# Patient Record
Sex: Female | Born: 1935
Health system: Southern US, Community
[De-identification: ages and names within clinical notes are randomized; demographics above are authoritative.]

## PROBLEM LIST (undated history)

## (undated) DIAGNOSIS — M1990 Unspecified osteoarthritis, unspecified site: Secondary | ICD-10-CM

## (undated) DIAGNOSIS — Z853 Personal history of malignant neoplasm of breast: Principal | ICD-10-CM

## (undated) DIAGNOSIS — C50919 Malignant neoplasm of unspecified site of unspecified female breast: Secondary | ICD-10-CM

## (undated) HISTORY — PX: BREAST LUMPECTOMY: SHX2

## (undated) HISTORY — PX: CATARACT EXTRACTION: SUR2

## (undated) HISTORY — DX: Personal history of malignant neoplasm of breast: Z85.3

## (undated) HISTORY — PX: BREAST EXCISIONAL BIOPSY: SUR124

## (undated) HISTORY — DX: Unspecified osteoarthritis, unspecified site: M19.90

## (undated) HISTORY — DX: Malignant neoplasm of unspecified site of unspecified female breast: C50.919

---

## 1998-08-02 ENCOUNTER — Ambulatory Visit (HOSPITAL_COMMUNITY): Admission: RE | Admit: 1998-08-02 | Discharge: 1998-08-02 | Payer: Self-pay | Admitting: Family Medicine

## 1998-08-20 DIAGNOSIS — C50919 Malignant neoplasm of unspecified site of unspecified female breast: Secondary | ICD-10-CM

## 1998-08-20 HISTORY — PX: BREAST SURGERY: SHX581

## 1998-08-20 HISTORY — DX: Malignant neoplasm of unspecified site of unspecified female breast: C50.919

## 1999-08-02 ENCOUNTER — Encounter (INDEPENDENT_AMBULATORY_CARE_PROVIDER_SITE_OTHER): Payer: Self-pay | Admitting: *Deleted

## 1999-08-02 ENCOUNTER — Ambulatory Visit (HOSPITAL_BASED_OUTPATIENT_CLINIC_OR_DEPARTMENT_OTHER): Admission: RE | Admit: 1999-08-02 | Discharge: 1999-08-02 | Payer: Self-pay | Admitting: Surgery

## 1999-08-02 DIAGNOSIS — Z853 Personal history of malignant neoplasm of breast: Secondary | ICD-10-CM

## 1999-08-02 HISTORY — DX: Personal history of malignant neoplasm of breast: Z85.3

## 1999-08-23 ENCOUNTER — Encounter: Admission: RE | Admit: 1999-08-23 | Discharge: 1999-11-21 | Payer: Self-pay | Admitting: *Deleted

## 1999-08-25 ENCOUNTER — Ambulatory Visit (HOSPITAL_COMMUNITY): Admission: RE | Admit: 1999-08-25 | Discharge: 1999-08-25 | Payer: Self-pay | Admitting: Surgery

## 1999-08-25 ENCOUNTER — Encounter (INDEPENDENT_AMBULATORY_CARE_PROVIDER_SITE_OTHER): Payer: Self-pay | Admitting: *Deleted

## 1999-08-25 ENCOUNTER — Encounter: Payer: Self-pay | Admitting: Surgery

## 2000-04-23 ENCOUNTER — Encounter: Payer: Self-pay | Admitting: Emergency Medicine

## 2000-04-23 ENCOUNTER — Emergency Department (HOSPITAL_COMMUNITY): Admission: EM | Admit: 2000-04-23 | Discharge: 2000-04-23 | Payer: Self-pay | Admitting: Emergency Medicine

## 2000-12-18 ENCOUNTER — Emergency Department (HOSPITAL_COMMUNITY): Admission: EM | Admit: 2000-12-18 | Discharge: 2000-12-18 | Payer: Self-pay | Admitting: Emergency Medicine

## 2000-12-18 ENCOUNTER — Encounter: Payer: Self-pay | Admitting: Emergency Medicine

## 2002-04-23 ENCOUNTER — Other Ambulatory Visit: Admission: RE | Admit: 2002-04-23 | Discharge: 2002-04-23 | Payer: Self-pay | Admitting: Family Medicine

## 2005-01-23 ENCOUNTER — Other Ambulatory Visit: Admission: RE | Admit: 2005-01-23 | Discharge: 2005-01-23 | Payer: Self-pay | Admitting: Gynecology

## 2005-04-11 ENCOUNTER — Encounter: Admission: RE | Admit: 2005-04-11 | Discharge: 2005-04-11 | Payer: Self-pay | Admitting: Family Medicine

## 2005-06-25 ENCOUNTER — Ambulatory Visit: Payer: Self-pay

## 2005-11-01 ENCOUNTER — Encounter: Admission: RE | Admit: 2005-11-01 | Discharge: 2005-11-01 | Payer: Self-pay | Admitting: Surgery

## 2006-11-04 ENCOUNTER — Encounter: Admission: RE | Admit: 2006-11-04 | Discharge: 2006-11-04 | Payer: Self-pay | Admitting: Surgery

## 2007-04-24 ENCOUNTER — Other Ambulatory Visit: Admission: RE | Admit: 2007-04-24 | Discharge: 2007-04-24 | Payer: Self-pay | Admitting: Gynecology

## 2007-11-05 ENCOUNTER — Encounter (INDEPENDENT_AMBULATORY_CARE_PROVIDER_SITE_OTHER): Payer: Self-pay | Admitting: Radiology

## 2007-11-05 ENCOUNTER — Encounter: Admission: RE | Admit: 2007-11-05 | Discharge: 2007-11-05 | Payer: Self-pay | Admitting: Family Medicine

## 2008-01-05 ENCOUNTER — Encounter: Admission: RE | Admit: 2008-01-05 | Discharge: 2008-01-05 | Payer: Self-pay | Admitting: Surgery

## 2008-01-06 ENCOUNTER — Ambulatory Visit (HOSPITAL_BASED_OUTPATIENT_CLINIC_OR_DEPARTMENT_OTHER): Admission: RE | Admit: 2008-01-06 | Discharge: 2008-01-06 | Payer: Self-pay | Admitting: Surgery

## 2008-01-06 ENCOUNTER — Encounter (INDEPENDENT_AMBULATORY_CARE_PROVIDER_SITE_OTHER): Payer: Self-pay | Admitting: Surgery

## 2008-01-06 ENCOUNTER — Encounter: Admission: RE | Admit: 2008-01-06 | Discharge: 2008-01-06 | Payer: Self-pay | Admitting: Surgery

## 2010-07-07 ENCOUNTER — Encounter: Admission: RE | Admit: 2010-07-07 | Discharge: 2010-07-07 | Payer: Self-pay | Admitting: Family Medicine

## 2011-01-02 NOTE — Op Note (Signed)
NAMEODESSER, TOURANGEAU                  ACCOUNT NO.:  [id number]   MEDICAL RECORD NO.:  [id number]          PATIENT TYPE:  AMB   LOCATION:  DSC                          FACILITY:  [facility]   PHYSICIAN:  Currie Paris, M.D.DATE OF BIRTH:  August 29, 1931   DATE OF PROCEDURE:  01/06/2008  DATE OF DISCHARGE:                               OPERATIVE REPORT   PREOPERATIVE DIAGNOSIS:  Right breast mass with history of carcinoma.   POSTOPERATIVE DIAGNOSIS:  Right breast mass with history of carcinoma.   OPERATION:  Needle-guided excision of right breast mass.   SURGEON:  Currie Paris, M.D.   ANESTHESIA:  MAC.   CLINICAL HISTORY:  This is a 77 year old lady who has undergone central  partial mastectomy in 2001 for receptor positive carcinoma.  She  recently had a mammogram showing an abnormality, and a biopsy showed fat  necrosis, but we were concerned that we had inadequate tissue and  nonconformity between the mammogram films and the report.  Therefore,  excisional biopsy was recommended.  The mass was not palpable.   DESCRIPTION OF PROCEDURE:  The patient was seen in the holding area.  She had no further questions.  We identified and marked the right breast  as the operative site.  I reviewed the guidewire films.  The radiologist  placed an ink mark x, directly over the mass, and guidewire entered  medially towards the sternum and tracked towards the nipple laterally in  a horizontal direction.   The patient was taken to the operative room.  After satisfactory IV  sedation, the breast was prepped and draped, and the timeout was done.   Then, I infiltrated 1% Xylocaine mixed with 0.5% plain Marcaine.  I made  directly transverse incision over the guidewire track and manipulated  the guidewire into the wound.  A hard area was palpable, and I went  around this with a cautery, trying to get completely around this in all  directions and going down towards the chest wall leaving the  fascia.   Specimen mammogram was done and the lesion appeared to be shown in that  specimen.  I got beyond the tip of the guidewire with the excision.   I made sure everything was dry and put more local in.  I then closed  with 3-0 Vicryl, 4-0 Monocryl subcuticular, and Dermabond.   The patient tolerated the procedure well, and there were no  complications.  All counts were correct.      Currie Paris, M.D.  Electronically Signed     CJS/MEDQ  D:  01/06/2008  T:  01/07/2008  Job:  [id number]   cc:   Otilio Connors. Gerri Spore, M.D.

## 2011-06-07 ENCOUNTER — Other Ambulatory Visit (HOSPITAL_COMMUNITY): Payer: Self-pay | Admitting: Family Medicine

## 2011-06-07 DIAGNOSIS — Z1231 Encounter for screening mammogram for malignant neoplasm of breast: Secondary | ICD-10-CM

## 2011-07-17 ENCOUNTER — Ambulatory Visit (HOSPITAL_COMMUNITY)
Admission: RE | Admit: 2011-07-17 | Discharge: 2011-07-17 | Disposition: A | Discharge status: Home / Self Care | Payer: Medicare Other | Source: Ambulatory Visit | Attending: Family Medicine | Admitting: Family Medicine

## 2011-07-17 DIAGNOSIS — Z1231 Encounter for screening mammogram for malignant neoplasm of breast: Secondary | ICD-10-CM | POA: Insufficient documentation

## 2012-02-05 DIAGNOSIS — E7800 Pure hypercholesterolemia, unspecified: Secondary | ICD-10-CM | POA: Diagnosis not present

## 2012-02-05 DIAGNOSIS — Z1211 Encounter for screening for malignant neoplasm of colon: Secondary | ICD-10-CM | POA: Diagnosis not present

## 2012-02-05 DIAGNOSIS — R7402 Elevation of levels of lactic acid dehydrogenase (LDH): Secondary | ICD-10-CM | POA: Diagnosis not present

## 2012-02-05 DIAGNOSIS — Z0000 Encounter for general adult medical examination without abnormal findings: Secondary | ICD-10-CM | POA: Diagnosis not present

## 2012-02-05 DIAGNOSIS — I8390 Asymptomatic varicose veins of unspecified lower extremity: Secondary | ICD-10-CM | POA: Diagnosis not present

## 2012-02-05 DIAGNOSIS — Z853 Personal history of malignant neoplasm of breast: Secondary | ICD-10-CM | POA: Diagnosis not present

## 2012-05-05 DIAGNOSIS — M545 Low back pain, unspecified: Secondary | ICD-10-CM | POA: Diagnosis not present

## 2012-05-30 DIAGNOSIS — Z23 Encounter for immunization: Secondary | ICD-10-CM | POA: Diagnosis not present

## 2012-06-02 DIAGNOSIS — IMO0002 Reserved for concepts with insufficient information to code with codable children: Secondary | ICD-10-CM | POA: Diagnosis not present

## 2012-06-02 DIAGNOSIS — M5137 Other intervertebral disc degeneration, lumbosacral region: Secondary | ICD-10-CM | POA: Diagnosis not present

## 2012-06-02 DIAGNOSIS — M5030 Other cervical disc degeneration, unspecified cervical region: Secondary | ICD-10-CM | POA: Diagnosis not present

## 2012-06-07 DIAGNOSIS — M5137 Other intervertebral disc degeneration, lumbosacral region: Secondary | ICD-10-CM | POA: Diagnosis not present

## 2012-06-16 DIAGNOSIS — M5137 Other intervertebral disc degeneration, lumbosacral region: Secondary | ICD-10-CM | POA: Diagnosis not present

## 2012-06-16 DIAGNOSIS — M5030 Other cervical disc degeneration, unspecified cervical region: Secondary | ICD-10-CM | POA: Diagnosis not present

## 2012-06-16 DIAGNOSIS — IMO0002 Reserved for concepts with insufficient information to code with codable children: Secondary | ICD-10-CM | POA: Diagnosis not present

## 2012-07-01 ENCOUNTER — Other Ambulatory Visit (HOSPITAL_COMMUNITY): Payer: Self-pay | Admitting: Family Medicine

## 2012-07-01 DIAGNOSIS — Z1231 Encounter for screening mammogram for malignant neoplasm of breast: Secondary | ICD-10-CM

## 2012-07-09 DIAGNOSIS — M5137 Other intervertebral disc degeneration, lumbosacral region: Secondary | ICD-10-CM | POA: Diagnosis not present

## 2012-07-22 ENCOUNTER — Ambulatory Visit (HOSPITAL_COMMUNITY)
Admission: RE | Admit: 2012-07-22 | Discharge: 2012-07-22 | Disposition: A | Discharge status: Home / Self Care | Payer: Medicare Other | Source: Ambulatory Visit | Attending: Family Medicine | Admitting: Family Medicine

## 2012-07-22 DIAGNOSIS — Z1231 Encounter for screening mammogram for malignant neoplasm of breast: Secondary | ICD-10-CM | POA: Insufficient documentation

## 2012-07-24 DIAGNOSIS — M5137 Other intervertebral disc degeneration, lumbosacral region: Secondary | ICD-10-CM | POA: Diagnosis not present

## 2012-07-25 ENCOUNTER — Other Ambulatory Visit: Payer: Self-pay | Admitting: Family Medicine

## 2012-07-25 DIAGNOSIS — R928 Other abnormal and inconclusive findings on diagnostic imaging of breast: Secondary | ICD-10-CM

## 2012-08-05 ENCOUNTER — Other Ambulatory Visit: Payer: Medicare Other

## 2012-08-19 ENCOUNTER — Other Ambulatory Visit: Payer: Medicare Other

## 2012-08-26 ENCOUNTER — Other Ambulatory Visit: Payer: Self-pay | Admitting: Family Medicine

## 2012-08-26 ENCOUNTER — Ambulatory Visit
Admission: RE | Admit: 2012-08-26 | Discharge: 2012-08-26 | Disposition: A | Discharge status: Home / Self Care | Payer: Medicare Other | Source: Ambulatory Visit | Attending: Family Medicine | Admitting: Family Medicine

## 2012-08-26 DIAGNOSIS — R928 Other abnormal and inconclusive findings on diagnostic imaging of breast: Secondary | ICD-10-CM

## 2012-09-02 ENCOUNTER — Inpatient Hospital Stay: Admission: RE | Admit: 2012-09-02 | Payer: Medicare Other | Source: Ambulatory Visit

## 2012-09-02 ENCOUNTER — Ambulatory Visit
Admission: RE | Admit: 2012-09-02 | Discharge: 2012-09-02 | Disposition: A | Discharge status: Home / Self Care | Payer: Medicare Other | Source: Ambulatory Visit | Attending: Family Medicine | Admitting: Family Medicine

## 2012-09-02 ENCOUNTER — Other Ambulatory Visit: Payer: Self-pay | Admitting: Family Medicine

## 2012-09-02 DIAGNOSIS — R928 Other abnormal and inconclusive findings on diagnostic imaging of breast: Secondary | ICD-10-CM

## 2012-09-02 DIAGNOSIS — N641 Fat necrosis of breast: Secondary | ICD-10-CM | POA: Diagnosis not present

## 2013-02-06 DIAGNOSIS — Z853 Personal history of malignant neoplasm of breast: Secondary | ICD-10-CM | POA: Diagnosis not present

## 2013-02-06 DIAGNOSIS — Z0000 Encounter for general adult medical examination without abnormal findings: Secondary | ICD-10-CM | POA: Diagnosis not present

## 2013-02-06 DIAGNOSIS — E7800 Pure hypercholesterolemia, unspecified: Secondary | ICD-10-CM | POA: Diagnosis not present

## 2013-02-06 DIAGNOSIS — Z1211 Encounter for screening for malignant neoplasm of colon: Secondary | ICD-10-CM | POA: Diagnosis not present

## 2013-02-06 DIAGNOSIS — I8390 Asymptomatic varicose veins of unspecified lower extremity: Secondary | ICD-10-CM | POA: Diagnosis not present

## 2013-04-01 DIAGNOSIS — E7800 Pure hypercholesterolemia, unspecified: Secondary | ICD-10-CM | POA: Diagnosis not present

## 2013-05-13 DIAGNOSIS — Z23 Encounter for immunization: Secondary | ICD-10-CM | POA: Diagnosis not present

## 2013-05-21 DIAGNOSIS — H2510 Age-related nuclear cataract, unspecified eye: Secondary | ICD-10-CM | POA: Diagnosis not present

## 2013-05-21 DIAGNOSIS — H02839 Dermatochalasis of unspecified eye, unspecified eyelid: Secondary | ICD-10-CM | POA: Diagnosis not present

## 2013-05-21 DIAGNOSIS — H26019 Infantile and juvenile cortical, lamellar, or zonular cataract, unspecified eye: Secondary | ICD-10-CM | POA: Diagnosis not present

## 2013-05-27 ENCOUNTER — Encounter (INDEPENDENT_AMBULATORY_CARE_PROVIDER_SITE_OTHER): Payer: Self-pay

## 2013-05-27 ENCOUNTER — Encounter (INDEPENDENT_AMBULATORY_CARE_PROVIDER_SITE_OTHER): Payer: Self-pay | Admitting: Surgery

## 2013-05-27 ENCOUNTER — Ambulatory Visit (INDEPENDENT_AMBULATORY_CARE_PROVIDER_SITE_OTHER): Payer: Medicare Other | Admitting: Surgery

## 2013-05-27 ENCOUNTER — Ambulatory Visit
Admission: RE | Admit: 2013-05-27 | Discharge: 2013-05-27 | Disposition: A | Discharge status: Home / Self Care | Payer: Medicare Other | Source: Ambulatory Visit | Attending: Surgery | Admitting: Surgery

## 2013-05-27 ENCOUNTER — Other Ambulatory Visit (INDEPENDENT_AMBULATORY_CARE_PROVIDER_SITE_OTHER): Payer: Self-pay | Admitting: Surgery

## 2013-05-27 VITALS — BP 116/76 | HR 84 | Temp 96.8°F | Ht <= 58 in | Wt 141.6 lb

## 2013-05-27 DIAGNOSIS — N6310 Unspecified lump in the right breast, unspecified quadrant: Secondary | ICD-10-CM

## 2013-05-27 DIAGNOSIS — N641 Fat necrosis of breast: Secondary | ICD-10-CM | POA: Diagnosis not present

## 2013-05-27 DIAGNOSIS — Z853 Personal history of malignant neoplasm of breast: Secondary | ICD-10-CM

## 2013-05-27 NOTE — Progress Notes (Signed)
NAME: Melody Wilson       DOB: 30-Sep-1931           DATE: 05/27/2013       MRN: [id number]  CC:   Chief Complaint  Patient presents with  . Breast Mass    eval Rt br lump    Melody Wilson is a 79 y.o.Marland Kitchenfemale who presents for routine followup of her Right IDC, stage 1, receptor+, Her2neg diagnosed in 2000 and treated with central partial mastectomy, radiation. She has no problems or concerns on either side.However in 2009 she had a Bx of the surgical area due to calcs, found to be fat necrosis. Again in Jan 2014 another NCB  For a small mass under the scar showed Fat necrosis. She was to have repeat sono, but the letter from the breast center stated left instead of right, so she cam to see me to find out waht was going on.  PFSH: She has had no significant changes since the last visit here.  ROS: There have been no significant changes since the last visit here  EXAM:  VS: BP 116/76  Pulse 84  Temp(Src) 96.8 F (36 C) (Temporal)  Ht 4\' 9"  (1.448 m)  Wt 141 lb 9.6 oz (64.229 kg)  BMI 30.63 kg/m2  SpO2 98%  General: The patient is alert, oriented, generally healthy appearing, NAD. Mood and affect are normal.  Breasts:  The left is normal The right is much smaller due to surgery and radiation. There is no definite palpable mass and overall feels like normal tissue.  Lymphatics: She has no axillary or supraclavicular adenopathy on either side.  Extremities: Full ROM of the surgical side with no lymphedema noted.  Data Reviewed: Reviewed my old notes, path reports and more recent mammograms and sonos.   Impression: Doing well, with no evidence of recurrent cancer or new cancer; Likely ongoing issues with fat necrosis  Plan: Discussed with Dr Deboraha Sprang at [facility]. They will see today to do sono. Long discussion with patient over her findings. There does not appear to be evidence of a local recurrence. If sono is stable, she should get f/u in six more months as part of her  routine annual f/u.

## 2013-05-27 NOTE — Patient Instructions (Signed)
Go up to the breast center so they can do the planned ultrasound of your right breast

## 2013-08-07 DIAGNOSIS — K5289 Other specified noninfective gastroenteritis and colitis: Secondary | ICD-10-CM | POA: Diagnosis not present

## 2013-09-01 ENCOUNTER — Other Ambulatory Visit: Payer: Self-pay

## 2013-09-01 DIAGNOSIS — Z1231 Encounter for screening mammogram for malignant neoplasm of breast: Secondary | ICD-10-CM

## 2013-09-04 ENCOUNTER — Ambulatory Visit
Admission: RE | Admit: 2013-09-04 | Discharge: 2013-09-04 | Disposition: A | Discharge status: Home / Self Care | Payer: Medicare Other | Source: Ambulatory Visit

## 2013-09-04 DIAGNOSIS — Z1231 Encounter for screening mammogram for malignant neoplasm of breast: Secondary | ICD-10-CM

## 2013-11-11 DIAGNOSIS — M25569 Pain in unspecified knee: Secondary | ICD-10-CM | POA: Diagnosis not present

## 2013-12-01 DIAGNOSIS — J309 Allergic rhinitis, unspecified: Secondary | ICD-10-CM | POA: Diagnosis not present

## 2014-02-10 DIAGNOSIS — J309 Allergic rhinitis, unspecified: Secondary | ICD-10-CM | POA: Diagnosis not present

## 2014-02-10 DIAGNOSIS — F329 Major depressive disorder, single episode, unspecified: Secondary | ICD-10-CM | POA: Diagnosis not present

## 2014-02-10 DIAGNOSIS — E7800 Pure hypercholesterolemia, unspecified: Secondary | ICD-10-CM | POA: Diagnosis not present

## 2014-02-10 DIAGNOSIS — F3289 Other specified depressive episodes: Secondary | ICD-10-CM | POA: Diagnosis not present

## 2014-02-10 DIAGNOSIS — Z23 Encounter for immunization: Secondary | ICD-10-CM | POA: Diagnosis not present

## 2014-02-10 DIAGNOSIS — Z0000 Encounter for general adult medical examination without abnormal findings: Secondary | ICD-10-CM | POA: Diagnosis not present

## 2014-03-10 DIAGNOSIS — L821 Other seborrheic keratosis: Secondary | ICD-10-CM | POA: Diagnosis not present

## 2014-03-10 DIAGNOSIS — L570 Actinic keratosis: Secondary | ICD-10-CM | POA: Diagnosis not present

## 2014-05-11 DIAGNOSIS — Z23 Encounter for immunization: Secondary | ICD-10-CM | POA: Diagnosis not present

## 2014-07-05 DIAGNOSIS — H2513 Age-related nuclear cataract, bilateral: Secondary | ICD-10-CM | POA: Diagnosis not present

## 2014-07-05 DIAGNOSIS — H25013 Cortical age-related cataract, bilateral: Secondary | ICD-10-CM | POA: Diagnosis not present

## 2014-08-04 DIAGNOSIS — S86919A Strain of unspecified muscle(s) and tendon(s) at lower leg level, unspecified leg, initial encounter: Secondary | ICD-10-CM | POA: Diagnosis not present

## 2014-11-15 ENCOUNTER — Other Ambulatory Visit: Payer: Self-pay

## 2014-11-15 ENCOUNTER — Ambulatory Visit
Admission: RE | Admit: 2014-11-15 | Discharge: 2014-11-15 | Disposition: A | Discharge status: Home / Self Care | Payer: Medicare Other | Source: Ambulatory Visit

## 2014-11-15 DIAGNOSIS — Z1231 Encounter for screening mammogram for malignant neoplasm of breast: Secondary | ICD-10-CM

## 2014-11-15 DIAGNOSIS — Z9889 Other specified postprocedural states: Secondary | ICD-10-CM

## 2014-12-27 DIAGNOSIS — M5442 Lumbago with sciatica, left side: Secondary | ICD-10-CM | POA: Diagnosis not present

## 2014-12-27 DIAGNOSIS — M5441 Lumbago with sciatica, right side: Secondary | ICD-10-CM | POA: Diagnosis not present

## 2014-12-27 DIAGNOSIS — M5136 Other intervertebral disc degeneration, lumbar region: Secondary | ICD-10-CM | POA: Diagnosis not present

## 2015-01-26 DIAGNOSIS — M5136 Other intervertebral disc degeneration, lumbar region: Secondary | ICD-10-CM | POA: Diagnosis not present

## 2015-02-10 DIAGNOSIS — M542 Cervicalgia: Secondary | ICD-10-CM | POA: Diagnosis not present

## 2015-02-10 DIAGNOSIS — M5441 Lumbago with sciatica, right side: Secondary | ICD-10-CM | POA: Diagnosis not present

## 2015-04-08 DIAGNOSIS — E780 Pure hypercholesterolemia: Secondary | ICD-10-CM | POA: Diagnosis not present

## 2015-04-08 DIAGNOSIS — J309 Allergic rhinitis, unspecified: Secondary | ICD-10-CM | POA: Diagnosis not present

## 2015-04-08 DIAGNOSIS — Z0000 Encounter for general adult medical examination without abnormal findings: Secondary | ICD-10-CM | POA: Diagnosis not present

## 2015-04-08 DIAGNOSIS — F3341 Major depressive disorder, recurrent, in partial remission: Secondary | ICD-10-CM | POA: Diagnosis not present

## 2015-04-08 DIAGNOSIS — R109 Unspecified abdominal pain: Secondary | ICD-10-CM | POA: Diagnosis not present

## 2015-04-15 ENCOUNTER — Other Ambulatory Visit: Payer: Self-pay | Admitting: Family Medicine

## 2015-04-15 DIAGNOSIS — R1084 Generalized abdominal pain: Secondary | ICD-10-CM

## 2015-05-04 ENCOUNTER — Other Ambulatory Visit: Payer: Self-pay

## 2015-05-09 DIAGNOSIS — M810 Age-related osteoporosis without current pathological fracture: Secondary | ICD-10-CM | POA: Diagnosis not present

## 2015-05-23 DIAGNOSIS — Z23 Encounter for immunization: Secondary | ICD-10-CM | POA: Diagnosis not present

## 2015-06-16 DIAGNOSIS — K2970 Gastritis, unspecified, without bleeding: Secondary | ICD-10-CM | POA: Diagnosis not present

## 2015-06-16 DIAGNOSIS — J309 Allergic rhinitis, unspecified: Secondary | ICD-10-CM | POA: Diagnosis not present

## 2015-06-16 DIAGNOSIS — S01302A Unspecified open wound of left ear, initial encounter: Secondary | ICD-10-CM | POA: Diagnosis not present

## 2015-06-16 DIAGNOSIS — H6122 Impacted cerumen, left ear: Secondary | ICD-10-CM | POA: Diagnosis not present

## 2015-07-19 DIAGNOSIS — M810 Age-related osteoporosis without current pathological fracture: Secondary | ICD-10-CM | POA: Diagnosis not present

## 2015-07-19 DIAGNOSIS — K2970 Gastritis, unspecified, without bleeding: Secondary | ICD-10-CM | POA: Diagnosis not present

## 2015-08-01 DIAGNOSIS — H2513 Age-related nuclear cataract, bilateral: Secondary | ICD-10-CM | POA: Diagnosis not present

## 2015-08-01 DIAGNOSIS — H25013 Cortical age-related cataract, bilateral: Secondary | ICD-10-CM | POA: Diagnosis not present

## 2015-10-12 ENCOUNTER — Other Ambulatory Visit: Payer: Self-pay

## 2015-10-12 DIAGNOSIS — Z1231 Encounter for screening mammogram for malignant neoplasm of breast: Secondary | ICD-10-CM

## 2015-11-16 ENCOUNTER — Ambulatory Visit
Admission: RE | Admit: 2015-11-16 | Discharge: 2015-11-16 | Disposition: A | Discharge status: Home / Self Care | Payer: Medicare Other | Source: Ambulatory Visit

## 2015-11-16 DIAGNOSIS — Z1231 Encounter for screening mammogram for malignant neoplasm of breast: Secondary | ICD-10-CM | POA: Diagnosis not present

## 2015-11-21 ENCOUNTER — Other Ambulatory Visit: Payer: Self-pay | Admitting: Family Medicine

## 2015-11-21 DIAGNOSIS — R928 Other abnormal and inconclusive findings on diagnostic imaging of breast: Secondary | ICD-10-CM

## 2015-11-28 DIAGNOSIS — J0110 Acute frontal sinusitis, unspecified: Secondary | ICD-10-CM | POA: Diagnosis not present

## 2015-12-08 ENCOUNTER — Ambulatory Visit
Admission: RE | Admit: 2015-12-08 | Discharge: 2015-12-08 | Disposition: A | Discharge status: Home / Self Care | Payer: Medicare Other | Source: Ambulatory Visit | Attending: Family Medicine | Admitting: Family Medicine

## 2015-12-08 ENCOUNTER — Other Ambulatory Visit: Payer: Self-pay | Admitting: Family Medicine

## 2015-12-08 DIAGNOSIS — N6310 Unspecified lump in the right breast, unspecified quadrant: Secondary | ICD-10-CM

## 2015-12-08 DIAGNOSIS — R928 Other abnormal and inconclusive findings on diagnostic imaging of breast: Secondary | ICD-10-CM

## 2015-12-08 DIAGNOSIS — N63 Unspecified lump in breast: Secondary | ICD-10-CM | POA: Diagnosis not present

## 2015-12-08 DIAGNOSIS — N6012 Diffuse cystic mastopathy of left breast: Secondary | ICD-10-CM | POA: Diagnosis not present

## 2015-12-12 ENCOUNTER — Other Ambulatory Visit: Payer: Self-pay | Admitting: Family Medicine

## 2015-12-12 DIAGNOSIS — N6310 Unspecified lump in the right breast, unspecified quadrant: Secondary | ICD-10-CM

## 2015-12-13 ENCOUNTER — Ambulatory Visit
Admission: RE | Admit: 2015-12-13 | Discharge: 2015-12-13 | Disposition: A | Discharge status: Home / Self Care | Payer: Medicare Other | Source: Ambulatory Visit | Attending: Family Medicine | Admitting: Family Medicine

## 2015-12-13 DIAGNOSIS — N6310 Unspecified lump in the right breast, unspecified quadrant: Secondary | ICD-10-CM

## 2015-12-13 DIAGNOSIS — N641 Fat necrosis of breast: Secondary | ICD-10-CM | POA: Diagnosis not present

## 2015-12-13 DIAGNOSIS — N63 Unspecified lump in breast: Secondary | ICD-10-CM | POA: Diagnosis not present

## 2016-02-06 DIAGNOSIS — R079 Chest pain, unspecified: Secondary | ICD-10-CM | POA: Diagnosis not present

## 2016-04-09 DIAGNOSIS — Z0000 Encounter for general adult medical examination without abnormal findings: Secondary | ICD-10-CM | POA: Diagnosis not present

## 2016-04-09 DIAGNOSIS — F3341 Major depressive disorder, recurrent, in partial remission: Secondary | ICD-10-CM | POA: Diagnosis not present

## 2016-04-09 DIAGNOSIS — K2970 Gastritis, unspecified, without bleeding: Secondary | ICD-10-CM | POA: Diagnosis not present

## 2016-04-09 DIAGNOSIS — E7800 Pure hypercholesterolemia, unspecified: Secondary | ICD-10-CM | POA: Diagnosis not present

## 2016-05-01 DIAGNOSIS — Z23 Encounter for immunization: Secondary | ICD-10-CM | POA: Diagnosis not present

## 2016-10-24 DIAGNOSIS — R079 Chest pain, unspecified: Secondary | ICD-10-CM | POA: Diagnosis not present

## 2016-10-24 DIAGNOSIS — L309 Dermatitis, unspecified: Secondary | ICD-10-CM | POA: Diagnosis not present

## 2016-10-24 DIAGNOSIS — R1013 Epigastric pain: Secondary | ICD-10-CM | POA: Diagnosis not present

## 2016-11-14 ENCOUNTER — Other Ambulatory Visit: Payer: Self-pay | Admitting: Family Medicine

## 2016-11-14 DIAGNOSIS — Z1231 Encounter for screening mammogram for malignant neoplasm of breast: Secondary | ICD-10-CM

## 2016-12-04 ENCOUNTER — Ambulatory Visit
Admission: RE | Admit: 2016-12-04 | Discharge: 2016-12-04 | Disposition: A | Discharge status: Home / Self Care | Payer: Medicare Other | Source: Ambulatory Visit | Attending: Family Medicine | Admitting: Family Medicine

## 2016-12-04 DIAGNOSIS — Z1231 Encounter for screening mammogram for malignant neoplasm of breast: Secondary | ICD-10-CM

## 2018-05-23 DIAGNOSIS — Z23 Encounter for immunization: Secondary | ICD-10-CM | POA: Diagnosis not present

## 2018-07-21 DIAGNOSIS — M1990 Unspecified osteoarthritis, unspecified site: Secondary | ICD-10-CM | POA: Diagnosis not present

## 2018-07-31 DIAGNOSIS — M25562 Pain in left knee: Secondary | ICD-10-CM | POA: Diagnosis not present

## 2018-07-31 DIAGNOSIS — M545 Low back pain: Secondary | ICD-10-CM | POA: Diagnosis not present

## 2018-08-07 DIAGNOSIS — M25562 Pain in left knee: Secondary | ICD-10-CM | POA: Diagnosis not present

## 2018-08-16 DIAGNOSIS — M25562 Pain in left knee: Secondary | ICD-10-CM | POA: Diagnosis not present

## 2018-08-21 DIAGNOSIS — M25562 Pain in left knee: Secondary | ICD-10-CM | POA: Diagnosis not present

## 2018-10-21 DIAGNOSIS — E7800 Pure hypercholesterolemia, unspecified: Secondary | ICD-10-CM | POA: Diagnosis not present

## 2018-10-21 DIAGNOSIS — Z0000 Encounter for general adult medical examination without abnormal findings: Secondary | ICD-10-CM | POA: Diagnosis not present

## 2018-10-21 DIAGNOSIS — Z5181 Encounter for therapeutic drug level monitoring: Secondary | ICD-10-CM | POA: Diagnosis not present

## 2019-05-20 DIAGNOSIS — M545 Low back pain: Secondary | ICD-10-CM | POA: Diagnosis not present

## 2019-05-22 DIAGNOSIS — Z23 Encounter for immunization: Secondary | ICD-10-CM | POA: Diagnosis not present

## 2019-10-12 ENCOUNTER — Ambulatory Visit: Payer: Medicare Other

## 2019-10-14 ENCOUNTER — Ambulatory Visit: Payer: Medicare Other

## 2019-10-16 ENCOUNTER — Ambulatory Visit: Payer: Medicare Other | Attending: Internal Medicine

## 2019-10-16 DIAGNOSIS — Z23 Encounter for immunization: Secondary | ICD-10-CM

## 2019-10-16 NOTE — Progress Notes (Signed)
   Covid-19 Vaccination Clinic  Name:  Melody Wilson    MRN: [id number] DOB: 1932/03/10  10/16/2019  Ms. Melody Wilson was observed post Covid-19 immunization for 15 minutes without incidence. She was provided with Vaccine Information Sheet and instruction to access the V-Safe system.   Ms. Melody Wilson was instructed to call 911 with any severe reactions post vaccine: Marland Kitchen Difficulty breathing  . Swelling of your face and throat  . A fast heartbeat  . A bad rash all over your body  . Dizziness and weakness    Immunizations Administered    Name Date Dose VIS Date Route   Pfizer COVID-19 Vaccine 10/16/2019  4:41 PM 0.3 mL 07/31/2019 Intramuscular   Manufacturer: [place]   Lot: [id number]   [place]: [id number]

## 2019-11-11 ENCOUNTER — Ambulatory Visit: Payer: Medicare Other | Attending: Internal Medicine

## 2019-11-11 DIAGNOSIS — Z23 Encounter for immunization: Secondary | ICD-10-CM

## 2019-11-11 NOTE — Progress Notes (Signed)
   Covid-19 Vaccination Clinic  Name:  Melody Wilson    MRN: [id number] DOB: 1932/03/14  11/11/2019  [name] was observed post Covid-19 immunization for 15 minutes without incident. She was provided with Vaccine Information Sheet and instruction to access the V-Safe system.   [name] was instructed to call 911 with any severe reactions post vaccine: Marland Kitchen Difficulty breathing  . Swelling of face and throat  . A fast heartbeat  . A bad rash all over body  . Dizziness and weakness   Immunizations Administered    Name Date Dose VIS Date Route   Pfizer COVID-19 Vaccine 11/11/2019  4:03 PM 0.3 mL 07/31/2019 Intramuscular   Manufacturer: [place]   Lot: [id number]   [place]: [id number]

## 2019-12-01 DIAGNOSIS — R5382 Chronic fatigue, unspecified: Secondary | ICD-10-CM | POA: Diagnosis not present

## 2019-12-01 DIAGNOSIS — R413 Other amnesia: Secondary | ICD-10-CM | POA: Diagnosis not present

## 2019-12-01 DIAGNOSIS — E538 Deficiency of other specified B group vitamins: Secondary | ICD-10-CM | POA: Diagnosis not present

## 2020-02-01 DIAGNOSIS — H5203 Hypermetropia, bilateral: Secondary | ICD-10-CM | POA: Diagnosis not present

## 2020-02-01 DIAGNOSIS — H04123 Dry eye syndrome of bilateral lacrimal glands: Secondary | ICD-10-CM | POA: Diagnosis not present

## 2020-02-01 DIAGNOSIS — H524 Presbyopia: Secondary | ICD-10-CM | POA: Diagnosis not present

## 2020-02-01 DIAGNOSIS — H25813 Combined forms of age-related cataract, bilateral: Secondary | ICD-10-CM | POA: Diagnosis not present

## 2020-02-03 ENCOUNTER — Encounter: Payer: Self-pay | Admitting: Neurology

## 2020-02-24 DIAGNOSIS — H25812 Combined forms of age-related cataract, left eye: Secondary | ICD-10-CM | POA: Diagnosis not present

## 2020-02-24 DIAGNOSIS — H2511 Age-related nuclear cataract, right eye: Secondary | ICD-10-CM | POA: Diagnosis not present

## 2020-03-02 DIAGNOSIS — H25012 Cortical age-related cataract, left eye: Secondary | ICD-10-CM | POA: Diagnosis not present

## 2020-03-02 DIAGNOSIS — H2512 Age-related nuclear cataract, left eye: Secondary | ICD-10-CM | POA: Diagnosis not present

## 2020-03-23 DIAGNOSIS — G44229 Chronic tension-type headache, not intractable: Secondary | ICD-10-CM | POA: Diagnosis not present

## 2020-04-15 ENCOUNTER — Encounter: Payer: Self-pay | Admitting: Neurology

## 2020-04-15 ENCOUNTER — Ambulatory Visit (INDEPENDENT_AMBULATORY_CARE_PROVIDER_SITE_OTHER): Payer: Medicare Other | Admitting: Neurology

## 2020-04-15 ENCOUNTER — Other Ambulatory Visit: Payer: Self-pay

## 2020-04-15 ENCOUNTER — Other Ambulatory Visit (INDEPENDENT_AMBULATORY_CARE_PROVIDER_SITE_OTHER): Payer: Medicare Other

## 2020-04-15 VITALS — BP 144/77 | HR 76 | Ht <= 58 in | Wt 121.0 lb

## 2020-04-15 DIAGNOSIS — F0390 Unspecified dementia without behavioral disturbance: Secondary | ICD-10-CM | POA: Diagnosis not present

## 2020-04-15 DIAGNOSIS — F03A0 Unspecified dementia, mild, without behavioral disturbance, psychotic disturbance, mood disturbance, and anxiety: Secondary | ICD-10-CM

## 2020-04-15 DIAGNOSIS — R519 Headache, unspecified: Secondary | ICD-10-CM

## 2020-04-15 LAB — SEDIMENTATION RATE: Sed Rate: 11 mm/hr (ref 0–30)

## 2020-04-15 MED ORDER — DONEPEZIL HCL 10 MG PO TABS: ORAL_TABLET | ORAL | 11 refills | Status: DC

## 2020-04-15 NOTE — Progress Notes (Signed)
NEUROLOGY CONSULTATION NOTE  LATEIA FRASER MRN: [id number] DOB: 1932-08-04  Referring provider: Dr. Maurice Small Primary care provider: Dr. Maurice Small  Reason for consult:  Evaluation for dementia  Dear Dr Justin Mend:  Thank you for your kind referral of IVANELL DESHOTEL for consultation of the above symptoms. Although her history is well known to you, please allow me to reiterate it for the purpose of our medical record. The patient was accompanied to the clinic by her 2 sons who also provide collateral information. Records and images were personally reviewed where available.   HISTORY OF PRESENT ILLNESS: This is a pleasant 86 year old right-handed woman with a history of breast cancer presenting for evaluation of dementia. Her 2 sons Shanon Brow and Mauricio Po are present and provided additional information privately as well. The patient feels her memory is "ok for my age." Her sons started noticing short-term memory changes for the past few years, but more concerning recently. She forgets dates and appointments. Bills were not getting paid, her sons report partly due to being unable to get to places to physically pay them being a challenge. She had not paid the homeowners fee last May. She gets confused when he asks her questions. She gets frustrated and would shut down, not wanting to talk. She continues to drive locally and denies getting lost, sons have not gotten any calls about this. They state she hardly leaves the house. She is not taking any regular medications. She used to have Tramadol for her back pain but she had started taking it daily so her sons took it out of the house. She takes Aleve prn. She does not cook as much and denies leaving the stove on. She has noticed she misplaces things, she has to double check things more. Her sons report she is writing things down and making lists. She is independent with dressing and bathing, no hygiene concerns, house is taken care of. No paranoia or  hallucinations. There was only one incident in the early Spring where she was convinced David's neighbor left a pile of wood in her neighbor's lawn, when he did not. No family history of dementia. No history of concussions or alcohol use.   She has had a daily headache for the past couple of months. She reports an achy pain on the vertex, like a headband, lasting a few hours. She does not take the Aleve daily for the headaches. There is no associated nausea/vomiting, photo/phonophobia, vision changes. She denies any dizziness, diplopia, dysarthria/dysphagia, bowel/bladder dysfunction, anosmia, or tremors. She reports pain going down her left arm and arthritis in both hands. Sleep is good, no wandering behavior. No falls.    Laboratory Data: 11/2019: TSH normal, B12 332  PAST MEDICAL HISTORY: Past Medical History:  Diagnosis Date  . Arthritis   . Cancer of breast ([place]) 2000   Right, IDC  . Personal history of malignant neoplasm of breast 08/02/1999   Stage 1 IDC, right, central, receptor+, Her2 neg, treated with central partial mastectomy, SLN and radiation     PAST SURGICAL HISTORY: Past Surgical History:  Procedure Laterality Date  . BREAST EXCISIONAL BIOPSY    . BREAST LUMPECTOMY    . BREAST SURGERY Right 2000   right central mastectomy    MEDICATIONS: Current Outpatient Medications on File Prior to Visit  Medication Sig Dispense Refill  . aspirin 81 MG tablet Take 81 mg by mouth daily.    . Multiple Vitamins-Minerals (MULTIVITAMIN WITH MINERALS) tablet Take 1 tablet  by mouth daily.    . naproxen sodium (ANAPROX) 220 MG tablet Take 220 mg by mouth 2 (two) times daily with a meal.    . vitamin C (ASCORBIC ACID) 500 MG tablet Take 500 mg by mouth daily.    . vitamin E 400 UNIT capsule Take 400 Units by mouth daily.     No current facility-administered medications on file prior to visit.    ALLERGIES: Allergies  Allergen Reactions  . Tylenol [Acetaminophen] Nausea And Vomiting     FAMILY HISTORY: Family History  Problem Relation Age of Onset  . Cancer Sister        Breast    SOCIAL HISTORY: Social History   Socioeconomic History  . Marital status: Single    Spouse name: Not on file  . Number of children: 2  . Years of education: Not on file  . Highest education level: Not on file  Occupational History  . Not on file  Tobacco Use  . Smoking status: Never Smoker  Vaping Use  . Vaping Use: Never used  Substance and Sexual Activity  . Alcohol use: No  . Drug use: No  . Sexual activity: Not on file  Other Topics Concern  . Not on file  Social History Narrative   Right Handed   One Story Home   Drinks Tea   Has two sons    Social Determinants of Health   Financial Resource Strain:   . Difficulty of Paying Living Expenses: Not on file  Food Insecurity:   . Worried About Charity fundraiser in the Last Year: Not on file  . Ran Out of Food in the Last Year: Not on file  Transportation Needs:   . Lack of Transportation (Medical): Not on file  . Lack of Transportation (Non-Medical): Not on file  Physical Activity:   . Days of Exercise per Week: Not on file  . Minutes of Exercise per Session: Not on file  Stress:   . Feeling of Stress : Not on file  Social Connections:   . Frequency of Communication with Friends and Family: Not on file  . Frequency of Social Gatherings with Friends and Family: Not on file  . Attends Religious Services: Not on file  . Active Member of Clubs or Organizations: Not on file  . Attends Archivist Meetings: Not on file  . Marital Status: Not on file  Intimate Partner Violence:   . Fear of Current or Ex-Partner: Not on file  . Emotionally Abused: Not on file  . Physically Abused: Not on file  . Sexually Abused: Not on file   PHYSICAL EXAM: Vitals:   04/15/20 0859  BP: (!) 144/77  Pulse: 76  SpO2: 98%   General: No acute distress Head:  Normocephalic/atraumatic Skin/Extremities: No rash, no  edema Neurological Exam: Mental status: alert and oriented to person, state. Year is 2020. No dysarthria or aphasia, Fund of knowledge is appropriate.  Recent and remote memory are impaired.  Attention and concentration are reduced.   SLUMS score 9/30. [employer] Mental Exam 04/15/2020  Weekday Correct 0  Current year 0  What state are we in? 1  Amount spent 0  Amount left 0  # of Animals 2  5 objects recall 0  Number series 2  Hour markers 2  Time correct 0  Placed X in triangle correctly 1  Largest Figure 1  Name of female 0  Date back to work 0  Type of work  0  State she lived in 0  Total score 9   Cranial nerves: CN I: not tested CN II: pupils equal, round and reactive to light, visual fields intact CN III, IV, VI:  full range of motion, no nystagmus, no ptosis CN V: facial sensation intact CN VII: upper and lower face symmetric CN VIII: hearing intact to conversation Bulk & Tone: normal, no fasciculations. Motor: 5/5 throughout with no pronator drift. Sensation: intact to light touch, pin, vibration sense. Decreased cold on left UE.Romberg test negative Deep Tendon Reflexes: +2 throughout Cerebellar: no incoordination on finger to nose testing Gait: narrow-based and steady, no ataxia Tremor: none   IMPRESSION: This is a pleasant 86 year old right-handed woman with a history of breast cancer presenting for evaluation of dementia. Her neurological exam is largely non-focal, she only reported subjective decreased sensation on the left arm. SLUMS score today 9/30. From her son's accounts, she is having difficulties with some complex tasks but overall remains independent at home. We discussed SLUMS score indicating dementia, by symptoms suggestive of mild dementia. She has also been having daily headaches the past few months. Check ESR. MRI brain without contrast will be ordered to assess for underlying structural abnormality. Neurocognitive testing will be ordered to  further evaluate cognitive concerns to help guide long-term management, we discussed eventual need for higher level of care/supervision. They are agreeable to starting Donepezil 63m 1/2 tab daily for 2 weeks, then increase to 1 tablet daily. Side effects discussed, sons advised to monitor medication and driving. Follow-up in 6 months, they know to call for any changes.   Thank you for allowing me to participate in the care of this patient. Please do not hesitate to call for any questions or concerns.   KEllouise Newer M.D.  CC: Dr. WJustin Mend

## 2020-04-15 NOTE — Patient Instructions (Signed)
1. Schedule MRI brain without contrast  2. Schedule Neurocognitive testing  3. Bloodwork for ESR  4. Start Donepezil $RemoveBeforeD'10mg'[id number]$ : Take 1/2 tablet daily for 2 weeks, then increase to 1 tablet daily  5. Continue to monitor driving, supervise medications  6. Follow-up in 6 months, call for any changes   FALL PRECAUTIONS: Be cautious when walking. Scan the area for obstacles that may increase the risk of trips and falls. When getting up in the mornings, sit up at the edge of the bed for a few minutes before getting out of bed. Consider elevating the bed at the head end to avoid drop of blood pressure when getting up. Walk always in a well-lit room (use night lights in the walls). Avoid area rugs or power cords from appliances in the middle of the walkways. Use a walker or a cane if necessary and consider physical therapy for balance exercise. Get your eyesight checked regularly.  FINANCIAL OVERSIGHT: Supervision, especially oversight when making financial decisions or transactions is also recommended as difficulties arise  HOME SAFETY: Consider the safety of the kitchen when operating appliances like stoves, microwave oven, and blender. Consider having supervision and share cooking responsibilities until no longer able to participate in those. Accidents with firearms and other hazards in the house should be identified and addressed as well.  DRIVING: Regarding driving, in patients with progressive memory problems, driving will be impaired. We advise to have someone else do the driving if trouble finding directions or if minor accidents are reported. Independent driving assessment is available to determine safety of driving.  ABILITY TO BE LEFT ALONE: If patient is unable to contact 911 operator, consider using LifeLine, or when the need is there, arrange for someone to stay with patients. Smoking is a fire hazard, consider supervision or cessation. Risk of wandering should be assessed by caregiver and if  detected at any point, supervision and safe proof recommendations should be instituted.  MEDICATION SUPERVISION: Inability to self-administer medication needs to be constantly addressed. Implement a mechanism to ensure safe administration of the medications.  RECOMMENDATIONS FOR ALL PATIENTS WITH MEMORY PROBLEMS: 1. Continue to exercise (Recommend 30 minutes of walking everyday, or 3 hours every week) 2. Increase social interactions - continue going to [place] and enjoy social gatherings with friends and family 3. Eat healthy, avoid fried foods and eat more fruits and vegetables 4. Maintain adequate blood pressure, blood sugar, and blood cholesterol level. Reducing the risk of stroke and cardiovascular disease also helps promoting better memory. 5. Avoid stressful situations. Live a simple life and avoid aggravations. Organize your time and prepare for the next day in anticipation. 6. Sleep well, avoid any interruptions of sleep and avoid any distractions in the bedroom that may interfere with adequate sleep quality 7. Avoid sugar, avoid sweets as there is a strong link between excessive sugar intake, diabetes, and cognitive impairment The Mediterranean diet has been shown to help patients reduce the risk of progressive memory disorders and reduces cardiovascular risk. This includes eating fish, eat fruits and green leafy vegetables, nuts like almonds and hazelnuts, walnuts, and also use olive oil. Avoid fast foods and fried foods as much as possible. Avoid sweets and sugar as sugar use has been linked to worsening of memory function.  There is always a concern of gradual progression of memory problems. If this is the case, then we may need to adjust level of care according to patient needs. Support, both to the patient and caregiver, should then be put  into place.

## 2020-04-18 ENCOUNTER — Telehealth: Payer: Self-pay

## 2020-04-18 NOTE — Telephone Encounter (Signed)
-----   Message from Cameron Sprang, MD sent at 04/15/2020 12:57 PM EDT ----- Pls let sons know bloodwork was normal, no sign of inflammation, thanks

## 2020-04-18 NOTE — Telephone Encounter (Signed)
Patient left message returning call to [facility].

## 2020-04-18 NOTE — Telephone Encounter (Signed)
Called and left message for a call back.

## 2020-04-20 DIAGNOSIS — Z23 Encounter for immunization: Secondary | ICD-10-CM | POA: Diagnosis not present

## 2020-04-20 NOTE — Telephone Encounter (Signed)
Called and left message for a call back.

## 2020-04-21 DIAGNOSIS — T50Z95A Adverse effect of other vaccines and biological substances, initial encounter: Secondary | ICD-10-CM | POA: Diagnosis not present

## 2020-04-21 NOTE — Telephone Encounter (Signed)
Spoke to patients son and informed him of results. Patients son verbalized understanding of results.

## 2020-04-21 NOTE — Telephone Encounter (Signed)
-----   Message from Cameron Sprang, MD sent at 04/15/2020 12:57 PM EDT ----- Pls let sons know bloodwork was normal, no sign of inflammation, thanks

## 2020-05-09 ENCOUNTER — Other Ambulatory Visit: Payer: Self-pay

## 2020-05-09 ENCOUNTER — Ambulatory Visit (INDEPENDENT_AMBULATORY_CARE_PROVIDER_SITE_OTHER): Payer: Medicare Other | Admitting: Counselor

## 2020-05-09 ENCOUNTER — Encounter: Payer: Self-pay | Admitting: Counselor

## 2020-05-09 ENCOUNTER — Ambulatory Visit: Payer: Medicare Other | Admitting: Psychology

## 2020-05-09 DIAGNOSIS — F09 Unspecified mental disorder due to known physiological condition: Secondary | ICD-10-CM | POA: Diagnosis not present

## 2020-05-09 DIAGNOSIS — R413 Other amnesia: Secondary | ICD-10-CM

## 2020-05-09 DIAGNOSIS — R4189 Other symptoms and signs involving cognitive functions and awareness: Secondary | ICD-10-CM

## 2020-05-09 NOTE — Progress Notes (Signed)
[facility]  Patient Name: Melody Wilson MRN: [id number] Date of Birth: Jun 17, 1932 Age: 86 y.o. Education: 12 years  Referral Circumstances and Background Information  Ms. Melody Wilson is a 86 y.o., right-hand dominant, widowed woman with a history of breast cancer and suspected dementia after an evaluation with Dr. Delice Wilson 04/15/2020. She obtained a SLUMS of 9 at that visit, concerns were raised about her cognitive capacities by her sons, and she was referred for neuropsychological evaluation for more detailed testing to establish her current level of function and inform treatment. She was already started on aricept by Dr. Delice Wilson.    On interview, the patient herself doesn't feel like she has been having problems with memory and thinking. She says that she has been thinking about her childhood a lot lately but she also thinks her short term memory is good. Her son, however, is concerned, he has noticed problems over the past 6 - 12 months. She will repeat herself and fails to retain conversations they have had and it sounds like there is rapid forgetting of information at this point in time. Her bank merged with another and the branch closed, and she continues to go there and will then think it is just closed for the day. Her son doesn't notice any particular problems with orientation to time although he has taken over scheduling her appointments, suggesting that she does have some problems. Her hasn't noticed much in the way of problems with language or with word finding or her use of the [place] language (she is [place]). She has no delusions, hallucinations, although she does get confused, and there are no significant personality changes or odd/inappropriate behaviors. The patient reported that her mood is fairly good and denied feeling depressed, lonely, or down. She stated that she doesn't want to go "to a home" and wants to stay with her family. She stated that  she is sleeping well, usually 6 or 7 hours, she likes to stay up late. She has lost a little bit of weight and doesn't have the appetite she used to, she was 140lbs and now weighs 121lbs. She stated that her energy is adequate. She stated that she watches a lot of TV, she likes working in her yard but it has been too hot.   The patient has had some functional decline as of late, she wasn't paying her bills and her son took some of them over and that was 3 months ago. She reported that she is still driving and that is going adequately, she mainly drives locally. Her son is somewhat concerned but it doesn't sound like there's a particular reason to be concerned. They have started to help with medications, since she was put on Aricept, her son organizes her medications for her and then monitors her compliance. She was vague about whether she still cooks, it sounds like she does not, her son said she isn't able to cook a full meal. They will cook food and bring it over for her. She is able to use the community as needed, such as doing her banking, going shopping, and the like. Her son said she doesn't have significant changes with judgment and problem solving although he also said that she wouldn't be able to handle a household emergency and she has had her finances taken over so there are changes. She has no changes in her basic self-care such as attentiveness to hygiene.   Past Medical History and Review of Relevant Studies  Patient Active Problem List   Diagnosis Date Noted  . Personal history of malignant neoplasm of breast 08/02/1999    Review of Neuroimaging and Relevant Medical History: The patient has no neuroimaging on file for review. She has a study scheduled for tomorrow.   Current Outpatient Medications  Medication Sig Dispense Refill  . aspirin 81 MG tablet Take 81 mg by mouth daily.    Marland Kitchen donepezil (ARICEPT) 10 MG tablet Take 1/2 tablet daily for 2 weeks, then increase to 1 tablet daily  30 tablet 11  . Multiple Vitamins-Minerals (MULTIVITAMIN WITH MINERALS) tablet Take 1 tablet by mouth daily.    . naproxen sodium (ANAPROX) 220 MG tablet Take 220 mg by mouth 2 (two) times daily with a meal.    . vitamin C (ASCORBIC ACID) 500 MG tablet Take 500 mg by mouth daily.    . vitamin E 400 UNIT capsule Take 400 Units by mouth daily.     No current facility-administered medications for this visit.   Family History  Problem Relation Age of Onset  . Cancer Sister        Breast   There is no  family history of dementia. There is no  family history of psychiatric illness.  Psychosocial History  Developmental, Educational and Employment History: The patient is a native of [place] and did school there. She lived in [place] before leaving and stated that the conditions were "really bad", although she denied any history of abuse. She learned English at a young age although her son said they primarily spoke [place] at home when he was growing up. The patient reported that she had no learning problems and was not "the highest" but did adequately. 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 She was vague about her employment history and has moved a lot throughout her life, she was born in [place], moved to the [place], back to [place], and then to the [place]. She moved to the [place] in [place] and worked in the [employer] until having children, and then stayed home to raise children.   Psychiatric History: None  Substance Use History: The patient is a never smoker, she doesn't consume alcohol, and she doesn't use any drugs.   Relationship History and Living Cimcumstances: The patient was married for many years, her husband passed 20 years ago. She has two sons, both of whom live in the area, and help her with things.   Mental Status and Behavioral Observations  Sensorium/Arousal: The patient's level of arousal was awake and alert. Hearing and  vision were adequate for testing purposes. Orientation: The patient's orientation was tenuous, she recalled orientation on the MMSE but then was confused when filling out a form, despite the fact that it is her husband's 100th birthday. Generally oriented to situation.  Appearance: The patient was dressed in appropriate, casual clothing with reasonable grooming and hygiene.  Behavior: Pleasant, appropriate, did present as though she was having some difficulties understanding the examiner that may have reflected ESL issues.  Speech/language: The patient spoke with a thick [place] accent and did seem to have difficulties with comprehension at times. She had occasional word finding problems.  Gait/Posture: Not formally examined, narrow based and steady with Dr. Delice Wilson.  Movement: No tremors, bradykinesia, hypokinesia, or other signs/symptoms of movement disorder noted.  Social Comportment: The patient was appropriate Mood: "Good" Affect: Mainly euthymic Thought process/content: The patient's thought process was somewhat tangential during history  giving, although much of that may have been related to misunderstanding questions. Safety: No safety concerns identified at today's visit.  Insight: Impaired.   MMSE - Mini Mental State Exam 05/09/2020  Orientation to time 4  Orientation to Place 4  Registration 3  Attention/ Calculation 5  Recall 0  Language- name 2 objects 1  Language- repeat 0  Language- follow 3 step command 3  Language- read & follow direction 1  Write a sentence 1  Copy design 0  Total score 22   Test Procedures  Wide Range Achievement Test - 4             Word Reading Neuropsychological Assessment Battery  List Learning  Naming  Digit Span Repeatable Battery for the Assessment of Neuropsychological Status (Form A)  Figure Copy  Judgment of Line Orientation  Coding  Figure Recall Controlled Oral Word Association (F-A-S) Semantic Fluency (Animals) Trail Making Test A  & B Complex Ideational Material Geriatric Depression Scale - Short Form Quick Dementia Rating System (completed by son, Fabienne Bruns)  Plan  ASHBY LEFLORE was seen for a psychiatric diagnostic evaluation and neuropsychological testing. She is a pleasant, 86 year old, [place] American woman with a history of memory and thinking problems that have become more noticeable over the past 6-12 months as per her son. She has started having difficulties with bill payment, cooking, and is no longer independent with managing her medications. Her son is involved and they are considering alternative living placements. She does have a thick [place] accent and seemed to have some ESL issues at times during the interview, although some of that may also represent a change because she has lived here for most of her life. Full and complete note with impressions, recommendations, and interpretation of test data to follow.   Viviano Simas Nicole Kindred, PsyD, [place] Clinical Neuropsychologist  Informed Consent and Coding/Compliance  Risks and benefits of the evaluation were discussed with the patient prior to all testing procedures. I conducted a clinical interview and neuropsychological testing (at least two tests) with Woodfin Ganja and Milana Kidney, B.S. (Technician) administered additional test procedures. The patient was able to tolerate the testing procedures and the patient (and/or family if applicable) is likely to benefit from further follow up to receive the diagnosis and treatment recommendations, which will be rendered at the next encounter. Billing below reflects technician time, my direct face-to-face time with the patient, time spent in test administration, and time spent in professional activities including but not limited to: neuropsychological test interpretation, integration of neuropsychological test data with clinical history, report preparation, treatment planning, care coordination, and review of diagnostically pertinent  medical history or studies.   Services associated with this encounter: Clinical Interview [phone]) plus 60 minutes (53646; Neuropsychological Evaluation by Professional)  100 minutes (80321; Neuropsychological Evaluation by Professional, Adl.) 16 minutes (22482; Test Administration by Professional) 30 minutes (50037; Neuropsychological Testing by Technician) 45 minutes (04888; Neuropsychological Testing by Technician, Adl.)

## 2020-05-09 NOTE — Progress Notes (Signed)
Psychometrist Note   Cognitive testing was administered to Melody Wilson by Melody Wilson, B.S. (Technician) under the supervision of Alphonzo Severance, Psy.D., ABN. Melody Wilson was able to tolerate all test procedures. Melody Wilson met with the patient as needed to manage any emotional reactions to the testing procedures. Rest breaks were offered.    The battery of tests administered was selected by Melody Wilson with consideration to the patient's current level of functioning, the nature of her symptoms, emotional and behavioral responses during the interview, level of literacy, observed level of motivation/effort, and the nature of the referral question. This battery was communicated to the psychometrist. Communication between Melody Wilson and the psychometrist was ongoing throughout the evaluation and Melody Wilson was immediately accessible at all times. Melody Wilson provided supervision to the technician on the date of this service, to the extent necessary to assure the quality of all services provided.    Melody Wilson will return in approximately one week for an interactive feedback session with Melody Wilson, at which time test performance, clinical impressions, and treatment recommendations will be reviewed in detail. The patient understands she can contact our office should she require our assistance before this time.   A total of 75 minutes of billable time were spent with Melody Wilson by the technician, including test administration and scoring time. Billing for these services is reflected in Melody Wilson note.   This note reflects time spent with the psychometrician and does not include test scores, clinical history, or any interpretations made by Melody Wilson. The full report will follow in a separate note.

## 2020-05-10 ENCOUNTER — Ambulatory Visit
Admission: RE | Admit: 2020-05-10 | Discharge: 2020-05-10 | Disposition: A | Discharge status: Home / Self Care | Payer: Medicare Other | Source: Ambulatory Visit | Attending: Neurology | Admitting: Neurology

## 2020-05-10 DIAGNOSIS — F03A0 Unspecified dementia, mild, without behavioral disturbance, psychotic disturbance, mood disturbance, and anxiety: Secondary | ICD-10-CM

## 2020-05-10 DIAGNOSIS — R413 Other amnesia: Secondary | ICD-10-CM | POA: Diagnosis not present

## 2020-05-10 DIAGNOSIS — R519 Headache, unspecified: Secondary | ICD-10-CM

## 2020-05-10 DIAGNOSIS — G319 Degenerative disease of nervous system, unspecified: Secondary | ICD-10-CM | POA: Diagnosis not present

## 2020-05-10 DIAGNOSIS — I6782 Cerebral ischemia: Secondary | ICD-10-CM | POA: Diagnosis not present

## 2020-05-10 DIAGNOSIS — R9082 White matter disease, unspecified: Secondary | ICD-10-CM | POA: Diagnosis not present

## 2020-05-11 ENCOUNTER — Telehealth: Payer: Self-pay

## 2020-05-11 NOTE — Telephone Encounter (Signed)
See other note, thanks

## 2020-05-11 NOTE — Telephone Encounter (Signed)
Pt Pt called , headaches continue, though MRI is negative, uses [facility] on [address].called , headaches continue, though MRI is negative, uses [facility] on [address].

## 2020-05-11 NOTE — Progress Notes (Signed)
[facility]  Patient Name: JAZILYN SIEGENTHALER MRN: [id number] Date of Birth: 12/04/1931 Age: 86 y.o. Education: 12 years  Measurement properties of test scores: IQ, Index, and Standard Scores (SS): Mean = 100; Standard Deviation = 15 Scaled Scores (Ss): Mean = 10; Standard Deviation = 3 Z scores (Z): Mean = 0; Standard Deviation = 1 T scores (T); Mean = 50; Standard Deviation = 10  TEST SCORES:    Note: This summary of test scores accompanies the interpretive report and should not be interpreted by unqualified individuals or in isolation without reference to the report. Test scores are relative to age, gender, and educational history as available and appropriate.   Mental Status Screening     Total Score Descriptor  MMSE 22 Mild Dementia      Expected Functioning        Wide Range Achievement Test: Standard/Scaled Score Percentile      Word Reading 102 55      Attention/Processing Speed        Neuropsychological Assessment Battery (Attention Module, Form 1): T-score Percentile      Digits Forward 33 5      Digits Backwards 34 5      Repeatable Battery for the Assessment of Neuropsychological Status (Form A): Scaled Score Percentile      Coding 4 2      Language        Neuropsychological Assessment Battery (Language Module, Form 1): T-score Percentile      Naming   (10) 19 <1      Verbal Fluency: T-score Percentile      Controlled Oral Word Association (F-A-S) 34 5      Semantic Fluency (Animals) 31 3      Memory:        Neuropsychological Assessment Battery (Memory Module, Form 1): T-score Percentile      List Learning           List A Immediate Recall   (2, 3, 3) 19 <1         List B Immediate Recall   (1) 34 5         List A Short Delayed Recall   (2) 33 5         List A Long Delayed Recall   (0) 32 4         List A Percent Retention   (0 %) --- 2         List A Long Delayed Yes/No Recognition Hits   (4) --- <1         List A  Long Delayed Yes/No Recognition False Alarms   (11) --- 8         List A Recognition Discriminability Index --- <1      Repeatable Battery for the Assessment of Neuropsychological Status (Form A): Scaled Score Percentile         Figure Recall   (0) 1 <1      Visuospatial/Constructional Functioning        Repeatable Battery for the Assessment of Neuropsychological Status (Form A): Standard/Scaled Score Percentile     Visuospatial/Constructional Index 72 3         Figure Copy   (14) 5 5         Judgment of Line Orientation   (12) --- 3-9      Executive Functioning            Trail Making Test: T-Score Percentile  Part A 45 31      Part B DC DC      [place] Diagnostic Aphasia Exam: Raw Score Scaled Score      Complex Ideational Material 9 5      Clock Drawing Raw Score Descriptor      Command 4 Moderate Impairment      Rating Scales        Clinical Dementia Rating Raw Score Descriptor      Sum of Boxes 4.5 Mild Dementia      Global Score 1.0 Mild Dementia      Quick Dementia Rating System Raw Score Descriptor      Sum of Boxes 3 Very Mild Dementia      Total Score 5 MCI  Geriatric Depression Scale - Short Form 5 Negative   Nathasha Fiorillo V. Nicole Kindred PsyD, [place] Clinical Neuropsychologist

## 2020-05-11 NOTE — Telephone Encounter (Signed)
-----   Message from Cameron Sprang, MD sent at 05/11/2020  9:51 AM EDT ----- Pls let patient know I reviewed MRI brain, it looks fine, no evidence of tumor, stroke, or bleed. It shows age-related changes. Thanks

## 2020-05-11 NOTE — Telephone Encounter (Signed)
Pt called no answer voice mail left for pt to call back 

## 2020-05-12 ENCOUNTER — Telehealth: Payer: Self-pay

## 2020-05-12 MED ORDER — GABAPENTIN 100 MG PO CAPS: 100.0000 mg | ORAL_CAPSULE | Freq: Every day | ORAL | 0 refills | Status: DC

## 2020-05-12 NOTE — Telephone Encounter (Signed)
Spoke to pt son, We can try starting a daily medication to help reduce the headaches, it is not like Tylenol that she takes and the headache goes away quickly, it is something that should build up in her system and have to give time to help. All the medications can potentially cause drowsiness, so take it at night. If she is agreeable, would start very low dose gabapentin 100mg  qhs and see how she does in 2 months

## 2020-05-12 NOTE — Telephone Encounter (Signed)
-----   Message from Cameron Sprang, MD sent at 05/11/2020  4:20 PM EDT ----- We can try starting a daily medication to help reduce the headaches, it is not like Tylenol that she takes and the headache goes away quickly, it is something that should build up in her system and have to give time to help. All the medications can potentially cause drowsiness, so take it at night. If she is agreeable, would start very low dose gabapentin 100mg  qhs and see how she does in 2 months. Pls send in Rx if agreeable, thanks

## 2020-05-12 NOTE — Telephone Encounter (Signed)
Pt called no answer voice mail left to call the office back  

## 2020-05-16 ENCOUNTER — Encounter: Payer: Medicare Other | Admitting: Counselor

## 2020-05-16 DIAGNOSIS — F3341 Major depressive disorder, recurrent, in partial remission: Secondary | ICD-10-CM | POA: Diagnosis not present

## 2020-05-16 DIAGNOSIS — M810 Age-related osteoporosis without current pathological fracture: Secondary | ICD-10-CM | POA: Diagnosis not present

## 2020-05-16 DIAGNOSIS — E7800 Pure hypercholesterolemia, unspecified: Secondary | ICD-10-CM | POA: Diagnosis not present

## 2020-05-16 DIAGNOSIS — Z5181 Encounter for therapeutic drug level monitoring: Secondary | ICD-10-CM | POA: Diagnosis not present

## 2020-05-16 DIAGNOSIS — Z0000 Encounter for general adult medical examination without abnormal findings: Secondary | ICD-10-CM | POA: Diagnosis not present

## 2020-05-18 NOTE — Progress Notes (Signed)
[facility]  Patient Name: Melody Wilson MRN: [id number] Date of Birth: Oct 21, 1931 Age: 86 y.o. Education: 12 years  Clinical Impressions  Melody Wilson is a 86 y.o., right-hand dominant, [place]-American widowed woman with a history of breast cancer and suspected dementia who recently consulted with Dr. Delice Lesch related to her memory problems. She thinks her memory is fine but her son has noticed progressive changes over the past year or so, including rapid forgetting of information, with corresponding functional decline (no longer paying bills, son helping with medications). She will go to the bank near her house, which merged with another bank and the branch closed, and she thinks it is closed for the day. She has an MRI that has been resulted since her interview, which reveals mild generalized volume loss, mild leukoaraiosis, and is non-diagnostic.  Neuropsychological test findings must be interpreted cautiously in lieu of Melody Wilson's status as a non-native [place] speaker. She demonstrated low scores in most areas assessed, including on measures of attention and working memory, processing speed, language, visuospatial/constructional function, memory, and executive function. Her memory problems appear to be of a storage variety and affect her recall for both verbal and visual information, minimizing the chances that these low scores are substantially due to language confound. Her naming problems, on the other hand, are less diagnostic because many of the words that she missed she did not get with cuing and she also presented as unfamiliar with those words. She screened negative for the presence of depression and her CDR suggests a mild dementia level of function. I think there may be some underreporting of her issues due to her restricted activity level.   Melody Wilson is thus demonstrating a level of cognitive difficulties that amounts to a mild dementia level  problem. Given her notable memory storage problems and demographics Alzheimer's seems most likely, although subtyping is difficult on the basis of test data related to language confound. She is already on Aricept. I will discuss with her and her son appropriate supports that should be in place, some of which they are already doing in terms of helping with medications and the like. Driving should be carefully monitored. She may benefit from living in a setting where she can have more activities and social support, such as assisted living, which I will discuss with her son. MIND diet and use of compensatory strategies may also be helpful.    Diagnostic Impressions: Alzheimer's dementia without behavioral disturbance, late onset  Test Findings  Test scores are summarized in additional documentation associated with this encounter. Test scores are relative to age, gender, and educational history as available and appropriate. Give Melody Wilson's status as a non-native [place] speaker, test findings must be interpreted cautiously given that these instruments are designed for and normed on [place] native [place] speaking samples. There were no concerns about performance validity as all findings fell within normal expectations.   General Intellectual Functioning/Achievement:  Performance on single word reading was average, suggesting good faculty with the [place] language. Expected functioning likely falls within the average range given the patient's level of educational and occupational achievement.   Attention and Processing Efficiency: Performance on indicators of attention and working memory was low with unusually low scores for digit repetition forward and backward.   With respect to processing speed, timed number-symbol coding was extremely low whereas performance was average on simple numeric sequencing.   Language: Performance on language measures was generally low, with extremely low  visual  object confrontation naming. Because she did not present as familiar with many of these words and did not retrieve them even with prompts, this may reflect ESL issues and is of less clear diagnostic significance. Generation of words was unusually low in response to both given letters and the category prompt "animals."   Visuospatial Function: Performance on visuospatial and constructional indicators was unusually low, overall, although some of that is due to sloppiness and poor self-regulation as demonstrated on figure copy. Comparable unusually low scores were demonstrated on constructional and perceptual measures.   Learning and Memory: Performance on measures of learning and memory was generally low, with impaired acquisition and retention of information across time. This pattern of findings was consistent for both verbal and visual information and likely represent a storage problem.   In the verbal realm, Melody Wilson learned 2, 3, and 3 words of a 12-item list across three repetitions followed by unusually low performance on short delayed recall (two words) and on delayed recall (did not recall any words). She correctly identified only 4 of these words on forced-choice recognition with 11 false positive errors, generating an extremely low score.   In the visual realm, delayed recall for a modestly complex geometric figure was low, she did not remember any figure details.   Executive Functions: Melody Wilson demonstrated impaired performance within this domain. Reasoning with verbal information was unusually low on the Complex Ideational Material. Clock drawing was suggestive of "moderate impairment" with poorly formed face, illegible numbers, and no hands despite two attempts. Alternating sequencing of numbers and letters of the alphabet had to be discontinued because she had a hard time with the sample item.   Rating Scale(s): The patient's son characterized her as functioning at a mild cognitive  impairment to very mild dementia level. My sense is that mild dementia is the best staging considering all the evidence and that some of the impairment is not being noticed related to her restricted behavioral repertoire. Her Global CDR is 1 and her Sum of Boxes score is 4.5. She screened negative for the presence of depression.   Viviano Simas Nicole Kindred PsyD, [place] Clinical Neuropsychologist

## 2020-05-26 ENCOUNTER — Other Ambulatory Visit: Payer: Self-pay

## 2020-05-26 ENCOUNTER — Ambulatory Visit (INDEPENDENT_AMBULATORY_CARE_PROVIDER_SITE_OTHER): Payer: Medicare Other | Admitting: Counselor

## 2020-05-26 ENCOUNTER — Encounter: Payer: Self-pay | Admitting: Counselor

## 2020-05-26 DIAGNOSIS — G301 Alzheimer's disease with late onset: Secondary | ICD-10-CM

## 2020-05-26 DIAGNOSIS — F0280 Dementia in other diseases classified elsewhere without behavioral disturbance: Secondary | ICD-10-CM

## 2020-05-26 NOTE — Patient Instructions (Signed)
Your performance and presentation on assessment today were consistent with memory storage and other cognitive problems. While the data do have to be interpreted cautiously because you are not a native [place] speaker, they suggest fairly significant cognitive problems. Given your clinical history, I think you have reached a dementia level of function.   Dementia refers to a group of syndromes where multiple areas of ability are damaged in the brain, such as memory, thinking, judgment, and behavior, and most commonly refers to age related causes of dementia that cause worsening in these abilities over time. Alzheimer's disease is the most common form of dementia in people over the age of 84. Not all dementias are Alzheimer's disease, but all Alzheimer's disease is dementia. When dementia is due to an underlying condition affecting the brain, such as Alzheimer's disease, there is progression over time, which typically procedes gradually over many years.   In your case, I believe your dementia is due to Alzheimer's disease most likely, which is the most common cause of age-related cognitive decline. This means that your condition will worsen over time.   We discussed community resources, and I gave you information on senior line and help at home. I also referred you to Alzheimer's Association, who will be in touch in the coming weeks and can help coordinate resources with you.   I would recommend that you consult with an elder care attorney regarding advanced directives if you have not, such as a healthcare power of attorney and living will. Consultation with an elder care attorney can help you protect your estate and communicate your preferences to your loved ones formally, in the event you are not able to do so yourself. I can provide my strong recommendation for the [employer], who are located at [address] in [place], [place]. They can be reached at [phone]. They also have a website  [email or web address][email or web address].   Exercise is one of the best medicines for promoting health and maintaining cognitive fitness at all stages in life. Exercise probably has the largest documented effect on brain health and performance of any lifestyle intervention. Studies have shown that even previously sedentary individuals who start exercising as late as age 33 show a significant survival benefit as compared to their non-exercising peers. In the [place], the current guidelines are for 30 minutes of moderate exercise per day, but increasing your activity level less than that may also be helpful. You do not have to get your 30 minutes of exercise in one shot and exercising for short periods of time spread throughout the day can be helpful. Go for several walks, learn to dance, or do something else you enjoy that gets your body moving. Of course, if you have an underlying medical condition or there is any question about whether it is safe for you to exercise, you should consult a medical treatment provider prior to beginning exercise.   There is now good quality evidence from at least one large scale study that a modified mediterranean diet may help slow cognitive decline. This is known as the "MIND" diet. The Mind diet is not so much a specific diet as it is a set of recommendations for things that you should and should not eat.   Foods that are ENCOURAGED on the MIND Diet:  Green, leafy vegetables: Aim for six or more servings per week. This includes kale, spinach, cooked greens and salads.  All other vegetables: Try to eat another vegetable in addition to  the green leafy vegetables at least once a day. It is best to choose non-starchy vegetables because they have a lot of nutrients with a low number of calories.  Berries: Eat berries at least twice a week. There is a plethora of research on strawberries, and other berries such as blueberries, raspberries and blackberries have also been found to have  antioxidant and brain health benefits.  Nuts: Try to get five servings of nuts or more each week. The creators of the [place] don't specify what kind of nuts to consume, but it is probably best to vary the type of nuts you eat to obtain a variety of nutrients. Peanuts are a legume and do not fall into this category.  Olive oil: Use olive oil as your main cooking oil. There may be other heart-healthy alternatives such as algae oil, though there is not yet sufficient research upon which to base a formal recommendation.  Whole grains: Aim for at least three servings daily. Choose minimally processed grains like oatmeal, quinoa, brown rice, whole-wheat pasta and 100% whole-wheat bread.  Fish: Eat fish at least once a week. It is best to choose fatty fish like salmon, sardines, trout, tuna and mackerel for their high amounts of omega-3 fatty acids.  Beans: Include beans in at least four meals every week. This includes all beans, lentils and soybeans.  Poultry: Try to eat chicken or [place] at least twice a week. Note that fried chicken is not encouraged on the MIND diet.  Wine: Aim for no more than one glass of alcohol daily. Both red and white wine may benefit the brain. However, much research has focused on the red wine compound resveratrol, which may help protect against Alzheimer's disease.  Foods that are DISCOURAGED on the MIND Diet: Butter and margarine: Try to eat less than 1 tablespoon (about 14 grams) daily. Instead, try using olive oil as your primary cooking fat, and dipping your bread in olive oil with herbs.  Cheese: The MIND diet recommends limiting your cheese consumption to less than once per week.  Red meat: Aim for no more than three servings each week. This includes all beef, pork, lamb and products made from these meats.  [place] food: The MIND diet highly discourages fried food, especially the kind from fast-food restaurants. Limit your consumption to less than once per week.  Pastries  and sweets: This includes most of the processed junk food and desserts you can think of. Ice cream, cookies, brownies, snack cakes, donuts, candy and more. Try to limit these to no more than four times a week.

## 2020-05-26 NOTE — Progress Notes (Signed)
NEUROPSYCHOLOGY FEEDBACK NOTE [facility]  Feedback Note: I met with Melody Wilson to review the findings resulting from her neuropsychological evaluation. Since the last appointment, she has been about the same.Time was spent reviewing the impressions and recommendations that are detailed in the evaluation report. We discussed impression of mild stage dementia. We had a long discussion about necessary supports at this stage of the disease, which in Melody Wilson's case is mostly already being done, such as finance and medications. We also discussed settings that are appropriate for individuals with mild stage AD, including home with some assistance, assisted living, and independent living. Melody Wilson's son's are concerned that she is quite isolated although she is hesitant to leave the home, and we processed that. There are no safety concerns at present. I took time to explain the findings and answer all the patient's questions. I encouraged Melody Wilson to contact me should she have any further questions or if further follow up is desired.   Current Medications and Medical History   Current Outpatient Medications  Medication Sig Dispense Refill  . aspirin 81 MG tablet Take 81 mg by mouth daily.    . donepezil (ARICEPT) 10 MG tablet Take 1/2 tablet daily for 2 weeks, then increase to 1 tablet daily 30 tablet 11  . gabapentin (NEURONTIN) 100 MG capsule Take 1 capsule (100 mg total) by mouth at bedtime. 90 capsule 0  . Multiple Vitamins-Minerals (MULTIVITAMIN WITH MINERALS) tablet Take 1 tablet by mouth daily.    . naproxen sodium (ANAPROX) 220 MG tablet Take 220 mg by mouth 2 (two) times daily with a meal.    . vitamin C (ASCORBIC ACID) 500 MG tablet Take 500 mg by mouth daily.    . vitamin E 400 UNIT capsule Take 400 Units by mouth daily.     No current facility-administered medications for this visit.    Patient Active Problem List   Diagnosis Date Noted  . Personal history of malignant  neoplasm of breast 08/02/1999    Mental Status and Behavioral Observations  Melody Wilson presented on time to the present encounter and was alert and generally oriented. Speech was normal in rate, rhythm, volume, and prosody. Self-reported mood was "good" and affect was neutral to somewhat anxious given the topic of conversation. Thought process was logical and goal-oriented, although her verbal participation in the encounter was limited, and thought content was appropriate. There were no safety concerns identified at today's encounter, such as thoughts of harming self or others.   Plan  Feedback provided regarding the patient's neuropsychological evaluation. We had a long discussion about different living circumstances, which was supplemented with referrals to Alzheimer's Association, and information on Senior Line and Right at Home. Melody Wilson was encouraged to contact me if any questions arise or if further follow up is desired.    V. , PsyD, ABN Clinical Neuropsychologist  Service(s) Provided at This Encounter: 43 minutes (90847; Conjoint therapy with patient present)   

## 2020-07-19 DIAGNOSIS — Z961 Presence of intraocular lens: Secondary | ICD-10-CM | POA: Diagnosis not present

## 2020-09-26 ENCOUNTER — Other Ambulatory Visit: Payer: Self-pay | Admitting: Neurology

## 2020-11-16 ENCOUNTER — Other Ambulatory Visit: Payer: Self-pay

## 2020-11-16 ENCOUNTER — Encounter: Payer: Self-pay | Admitting: Neurology

## 2020-11-16 ENCOUNTER — Ambulatory Visit (INDEPENDENT_AMBULATORY_CARE_PROVIDER_SITE_OTHER): Payer: Medicare Other | Admitting: Neurology

## 2020-11-16 VITALS — BP 147/62 | HR 73 | Ht <= 58 in | Wt 127.2 lb

## 2020-11-16 DIAGNOSIS — R5382 Chronic fatigue, unspecified: Secondary | ICD-10-CM | POA: Insufficient documentation

## 2020-11-16 DIAGNOSIS — G3184 Mild cognitive impairment, so stated: Secondary | ICD-10-CM

## 2020-11-16 DIAGNOSIS — R519 Headache, unspecified: Secondary | ICD-10-CM | POA: Diagnosis not present

## 2020-11-16 DIAGNOSIS — F0670 Mild neurocognitive disorder due to known physiological condition without behavioral disturbance: Secondary | ICD-10-CM | POA: Insufficient documentation

## 2020-11-16 DIAGNOSIS — F3340 Major depressive disorder, recurrent, in remission, unspecified: Secondary | ICD-10-CM | POA: Insufficient documentation

## 2020-11-16 DIAGNOSIS — E7800 Pure hypercholesterolemia, unspecified: Secondary | ICD-10-CM | POA: Insufficient documentation

## 2020-11-16 DIAGNOSIS — M810 Age-related osteoporosis without current pathological fracture: Secondary | ICD-10-CM | POA: Insufficient documentation

## 2020-11-16 DIAGNOSIS — G309 Alzheimer's disease, unspecified: Secondary | ICD-10-CM | POA: Diagnosis not present

## 2020-11-16 DIAGNOSIS — G9332 Myalgic encephalomyelitis/chronic fatigue syndrome: Secondary | ICD-10-CM | POA: Insufficient documentation

## 2020-11-16 DIAGNOSIS — G44229 Chronic tension-type headache, not intractable: Secondary | ICD-10-CM | POA: Insufficient documentation

## 2020-11-16 MED ORDER — DONEPEZIL HCL 10 MG PO TABS: ORAL_TABLET | ORAL | 11 refills | Status: DC

## 2020-11-16 MED ORDER — GABAPENTIN 100 MG PO CAPS: 100.0000 mg | ORAL_CAPSULE | Freq: Every day | ORAL | 11 refills | Status: DC

## 2020-11-16 NOTE — Patient Instructions (Signed)
1. Continue Donepezil 10mg  daily and Gabapentin 100mg  every night. Start looking into getting more help to manage medications regularly  2. Continue to monitor driving  3. Continue close supervision  4. Follow-up in 6 months, call for any changes   FALL PRECAUTIONS: Be cautious when walking. Scan the area for obstacles that may increase the risk of trips and falls. When getting up in the mornings, sit up at the edge of the bed for a few minutes before getting out of bed. Consider elevating the bed at the head end to avoid drop of blood pressure when getting up. Walk always in a well-lit room (use night lights in the walls). Avoid area rugs or power cords from appliances in the middle of the walkways. Use a walker or a cane if necessary and consider physical therapy for balance exercise. Get your eyesight checked regularly.  FINANCIAL OVERSIGHT: Supervision, especially oversight when making financial decisions or transactions is also recommended.  HOME SAFETY: Consider the safety of the kitchen when operating appliances like stoves, microwave oven, and blender. Consider having supervision and share cooking responsibilities until no longer able to participate in those. Accidents with firearms and other hazards in the house should be identified and addressed as well.  DRIVING: Regarding driving, in patients with progressive memory problems, driving will be impaired. We advise to have someone else do the driving if trouble finding directions or if minor accidents are reported. Independent driving assessment is available to determine safety of driving.  ABILITY TO BE LEFT ALONE: If patient is unable to contact 911 operator, consider using LifeLine, or when the need is there, arrange for someone to stay with patients. Smoking is a fire hazard, consider supervision or cessation. Risk of wandering should be assessed by caregiver and if detected at any point, supervision and safe proof recommendations should  be instituted.  MEDICATION SUPERVISION: Inability to self-administer medication needs to be constantly addressed. Implement a mechanism to ensure safe administration of the medications.  RECOMMENDATIONS FOR ALL PATIENTS WITH MEMORY PROBLEMS: 1. Continue to exercise (Recommend 30 minutes of walking everyday, or 3 hours every week) 2. Increase social interactions - continue going to [place] and enjoy social gatherings with friends and family 3. Eat healthy, avoid fried foods and eat more fruits and vegetables 4. Maintain adequate blood pressure, blood sugar, and blood cholesterol level. Reducing the risk of stroke and cardiovascular disease also helps promoting better memory. 5. Avoid stressful situations. Live a simple life and avoid aggravations. Organize your time and prepare for the next day in anticipation. 6. Sleep well, avoid any interruptions of sleep and avoid any distractions in the bedroom that may interfere with adequate sleep quality 7. Avoid sugar, avoid sweets as there is a strong link between excessive sugar intake, diabetes, and cognitive impairment We discussed the Mediterranean diet, which has been shown to help patients reduce the risk of progressive memory disorders and reduces cardiovascular risk. This includes eating fish, eat fruits and green leafy vegetables, nuts like almonds and hazelnuts, walnuts, and also use olive oil. Avoid fast foods and fried foods as much as possible. Avoid sweets and sugar as sugar use has been linked to worsening of memory function.  There is always a concern of gradual progression of memory problems. If this is the case, then we may need to adjust level of care according to patient needs. Support, both to the patient and caregiver, should then be put into place.

## 2020-11-16 NOTE — Progress Notes (Signed)
Assessment/Plan:   Dementia Mild Stage Alzheimer's Disease, late onset   . Discussed the diagnosis of Alzheimer's disease, and eventual progression. . Discussed with the patient and her son the necessity of taking Aricept on a regular basis, as the patient has been missing many of her doses.  At this point, it may be important to have a caregiver either at home, or to evaluate the possibility of living in senior care facility. . Discussed safety both in and out of the home.  The patient continues to drive, and remains fairly independent.  However, as she is becoming increasingly forgetful, as mentioned above, she may need either a caregiver or lives in a senior care facility, where she can increase her level of activity, and interact with other people, which be very beneficial in view of her diagnosis.  Other option for mobility would be to join the [facility] at the [facility] program for her to increase her physical activity. . Mediterranean diet is recommended  . Continue Aricept 10 mg daily . Follow up in 6 months   Headaches Continue Gabapentin 100 mg qhs (started on 05/2020) with careful monitoring, to avoid missing doses.    Subjective:   Melody Wilson was seen today in follow up for Alzheimer's disease, late onset.  This patient is accompanied in the office by her son who supplements the history.  My previous records as well as any outside records available were reviewed prior to today's visit.  Pt is currently on Aricept 10 mg daily without significant side effects.  According to her son, the patient has been missing several of her doses, up to half of her doses.  On the day today, the patient continues to drive 2 miles radius, and denies getting lost.  She walks independently, without a cane or a walker, and denies wondering off.  No falls, or head trauma is reported.    However, the patient hardly leaves her home, and likes to do a lot of gardening.  She likes to walk around her  property.  Her son reports that she hardly interacts with anyone.  She no longer cooks, only uses the microwave.  Her appetite is decreased, lost about 6 pounds.  Son states that she does not drink enough water.  She showers frequently and dresses herself.  She reports misplacing objects, but she quickly finds them.  The patient denies visual or auditory hallucinations, but she does think about remote situations, especially from World War II, which she says it terrifies her.  She also has nightmares, and her dreams feel real.  Her headaches are much improved with the initiation of gabapentin since October 2021, although she does not take this medicine on a regular basis, missing many of the doses.  In view that she is alone most of the time, missing medications, and not interacting or exercising, her son is evaluating the possibility of placement in a senior community, and has been looking into places such as [facility], [facility] and [facility]  He is also investigating home care.  "HISTORY OF PRESENT ILLNESS (from 04/15/2020):  This is a pleasant 87 year old right-handed woman with a history of breast cancer presenting for evaluation of dementia. Her 2 sons Melody Wilson and Melody Wilson are present and provided additional information privately as well. The patient feels her memory is "ok for my age." Her sons started noticing short-term memory changes for the past few years, but more concerning recently. She forgets dates and appointments. Bills were not getting  paid, her sons report partly due to being unable to get to places to physically pay them being a challenge. She had not paid the homeowners fee last May. She gets confused when he asks her questions. She gets frustrated and would shut down, not wanting to talk. She continues to drive locally and denies getting lost, sons have not gotten any calls about this. They state she hardly leaves the house. She is not taking any regular medications. She used to have Tramadol  for her back pain but she had started taking it daily so her sons took it out of the house. She takes Aleve prn. She does not cook as much and denies leaving the stove on. She has noticed she misplaces things, she has to double check things more. Her sons report she is writing things down and making lists. She is independent with dressing and bathing, no hygiene concerns, house is taken care of. No paranoia or hallucinations. There was only one incident in the early Spring where she was convinced Melody Wilson's neighbor left a pile of wood in her neighbor's lawn, when he did not. No family history of dementia. No history of concussions or alcohol use.    She has had a daily headache for the past couple of months. She reports an achy pain on the vertex, like a headband, lasting a few hours. She does not take the Aleve daily for the headaches. There is no associated nausea/vomiting, photo/phonophobia, vision changes. She denies any dizziness, diplopia, dysarthria/dysphagia, bowel/bladder dysfunction, anosmia, or tremors. She reports pain going down her left arm and arthritis in both hands. Sleep is good, no wandering behavior. No falls. "   Neurocognitive Feedback Note (Dr. Alphonzo Severance, 05/26/20) :  "..We discussed impression of mild stage dementia. We had a long discussion about necessary supports at this stage of the disease, which in Melody Wilson's case is mostly already being done, such as finance and medications. We also discussed settings that are appropriate for individuals with mild stage AD, including home with some assistance, assisted living, and independent living. Melody Wilson son's are concerned that she is quite isolated although she is hesitant to leave the home, and we processed that. Marland Kitchen.There are no safety concerns at present."   PREVIOUS MEDICATIONS: None   CURRENT MEDICATIONS:  Outpatient Encounter Medications as of 11/16/2020  Medication Sig  . aspirin 81 MG tablet Take 81 mg by mouth daily.  .  Multiple Vitamins-Minerals (MULTIVITAMIN WITH MINERALS) tablet Take 1 tablet by mouth daily.  . naproxen sodium (ANAPROX) 220 MG tablet Take 220 mg by mouth 2 (two) times daily with a meal.  . vitamin C (ASCORBIC ACID) 500 MG tablet Take 500 mg by mouth daily.  . vitamin E 400 UNIT capsule Take 400 Units by mouth daily.  . [DISCONTINUED] donepezil (ARICEPT) 10 MG tablet Take 1/2 tablet daily for 2 weeks, then increase to 1 tablet daily  . [DISCONTINUED] gabapentin (NEURONTIN) 100 MG capsule Take 1 capsule by mouth at bedtime  . donepezil (ARICEPT) 10 MG tablet Take 1 tablet daily  . gabapentin (NEURONTIN) 100 MG capsule Take 1 capsule (100 mg total) by mouth at bedtime.   No facility-administered encounter medications on file as of 11/16/2020.     Objective:     PHYSICAL EXAMINATION:    VITALS:   Vitals:   11/16/20 1505  BP: (!) 147/62  Pulse: 73  SpO2: 98%  Weight: 127 lb 3.2 oz (57.7 kg)  Height: 4\' 9"  (1.448 m)  GEN:  The patient appears stated age and is in NAD.  Patient is very thin. HEENT:  Normocephalic, atraumatic.   Neurological examination:  Orientation: The patient is alert. Oriented to person, place, does not know the date. Cranial nerves: There is good facial symmetry.The speech is fluent and clear.  Hearing is intact to conversational tone. Sensation: Sensation is intact to light touch throughout. LUE slightly decreased cold sensation, not a new finding. Motor: Strength is at least antigravity x4.  Movement examination: Tone: There is normal tone in the UE/LE Abnormal movements:  no tremor.  No myoclonus.  No asterixis.   Coordination:  There is no decremation with RAM's. Gait and Station: The patient has no difficulty arising out of a deep-seated chair without the use of the hands. The patient's stride length is good.    CBC No results found for: WBC, RBC, HGB, HCT, PLT, MCV, MCH, MCHC, RDW, LYMPHSABS, MONOABS, EOSABS, BASOSABS   No flowsheet data found.      Total time spent on today's visit was  40 minutes, including both face-to-face time and nonface-to-face time.  Time included that spent on review of records (prior notes available to me/labs/imaging if pertinent), discussing treatment and goals, answering patient's questions and coordinating care.  Cc:  Maurice Small, MD Sharene Butters, PA-C

## 2020-11-16 NOTE — Progress Notes (Signed)
Patient was seen, evaluated, and treatment plan was discussed with the Advanced Practice Provider.  Discussed diagnosis, prognosis, and management with patient and son. Discussed increasing medication supervision, continue Donepezil 10mg  daily and gabapentin 100mg  qhs. Family looking into assisted living facilities. I agree with the findings and the plan of care as documented by the Advanced Practice Provider.

## 2020-11-22 DIAGNOSIS — H0288B Meibomian gland dysfunction left eye, upper and lower eyelids: Secondary | ICD-10-CM | POA: Diagnosis not present

## 2020-11-22 DIAGNOSIS — H16143 Punctate keratitis, bilateral: Secondary | ICD-10-CM | POA: Diagnosis not present

## 2020-11-22 DIAGNOSIS — H0288A Meibomian gland dysfunction right eye, upper and lower eyelids: Secondary | ICD-10-CM | POA: Diagnosis not present

## 2020-11-22 DIAGNOSIS — H01021 Squamous blepharitis right upper eyelid: Secondary | ICD-10-CM | POA: Diagnosis not present

## 2021-02-06 DIAGNOSIS — H0288A Meibomian gland dysfunction right eye, upper and lower eyelids: Secondary | ICD-10-CM | POA: Diagnosis not present

## 2021-02-06 DIAGNOSIS — H0288B Meibomian gland dysfunction left eye, upper and lower eyelids: Secondary | ICD-10-CM | POA: Diagnosis not present

## 2021-02-06 DIAGNOSIS — H01021 Squamous blepharitis right upper eyelid: Secondary | ICD-10-CM | POA: Diagnosis not present

## 2021-02-06 DIAGNOSIS — H16143 Punctate keratitis, bilateral: Secondary | ICD-10-CM | POA: Diagnosis not present

## 2021-02-07 ENCOUNTER — Telehealth: Payer: Self-pay | Admitting: Physician Assistant

## 2021-02-07 NOTE — Telephone Encounter (Signed)
Son called in to let wertman know his mother will be going to an assisted living and he needs an RX for therapy service. [phone] is the best number to reach

## 2021-02-07 NOTE — Telephone Encounter (Signed)
Please see

## 2021-02-07 NOTE — Telephone Encounter (Signed)
Son is going to contact PCP and call the facility back.

## 2021-02-07 NOTE — Telephone Encounter (Signed)
Left message for patient to call office back with what is needed.

## 2021-02-07 NOTE — Telephone Encounter (Signed)
Son called christy back regarding an RX for his mama.

## 2021-02-17 DIAGNOSIS — R413 Other amnesia: Secondary | ICD-10-CM | POA: Diagnosis not present

## 2021-02-17 DIAGNOSIS — F0390 Unspecified dementia without behavioral disturbance: Secondary | ICD-10-CM | POA: Diagnosis not present

## 2021-02-17 DIAGNOSIS — R531 Weakness: Secondary | ICD-10-CM | POA: Diagnosis not present

## 2021-02-24 DIAGNOSIS — M810 Age-related osteoporosis without current pathological fracture: Secondary | ICD-10-CM | POA: Diagnosis not present

## 2021-02-24 DIAGNOSIS — F0390 Unspecified dementia without behavioral disturbance: Secondary | ICD-10-CM | POA: Diagnosis not present

## 2021-02-24 DIAGNOSIS — Z9181 History of falling: Secondary | ICD-10-CM | POA: Diagnosis not present

## 2021-02-24 DIAGNOSIS — J309 Allergic rhinitis, unspecified: Secondary | ICD-10-CM | POA: Diagnosis not present

## 2021-02-24 DIAGNOSIS — G44209 Tension-type headache, unspecified, not intractable: Secondary | ICD-10-CM | POA: Diagnosis not present

## 2021-02-24 DIAGNOSIS — Z7982 Long term (current) use of aspirin: Secondary | ICD-10-CM | POA: Diagnosis not present

## 2021-02-24 DIAGNOSIS — M549 Dorsalgia, unspecified: Secondary | ICD-10-CM | POA: Diagnosis not present

## 2021-02-24 DIAGNOSIS — E538 Deficiency of other specified B group vitamins: Secondary | ICD-10-CM | POA: Diagnosis not present

## 2021-02-24 DIAGNOSIS — R5382 Chronic fatigue, unspecified: Secondary | ICD-10-CM | POA: Diagnosis not present

## 2021-03-01 DIAGNOSIS — R5382 Chronic fatigue, unspecified: Secondary | ICD-10-CM | POA: Diagnosis not present

## 2021-03-01 DIAGNOSIS — F0390 Unspecified dementia without behavioral disturbance: Secondary | ICD-10-CM | POA: Diagnosis not present

## 2021-03-01 DIAGNOSIS — M810 Age-related osteoporosis without current pathological fracture: Secondary | ICD-10-CM | POA: Diagnosis not present

## 2021-03-01 DIAGNOSIS — E538 Deficiency of other specified B group vitamins: Secondary | ICD-10-CM | POA: Diagnosis not present

## 2021-03-01 DIAGNOSIS — M549 Dorsalgia, unspecified: Secondary | ICD-10-CM | POA: Diagnosis not present

## 2021-03-01 DIAGNOSIS — J309 Allergic rhinitis, unspecified: Secondary | ICD-10-CM | POA: Diagnosis not present

## 2021-03-03 DIAGNOSIS — J309 Allergic rhinitis, unspecified: Secondary | ICD-10-CM | POA: Diagnosis not present

## 2021-03-03 DIAGNOSIS — F0390 Unspecified dementia without behavioral disturbance: Secondary | ICD-10-CM | POA: Diagnosis not present

## 2021-03-03 DIAGNOSIS — M549 Dorsalgia, unspecified: Secondary | ICD-10-CM | POA: Diagnosis not present

## 2021-03-03 DIAGNOSIS — E538 Deficiency of other specified B group vitamins: Secondary | ICD-10-CM | POA: Diagnosis not present

## 2021-03-03 DIAGNOSIS — M810 Age-related osteoporosis without current pathological fracture: Secondary | ICD-10-CM | POA: Diagnosis not present

## 2021-03-03 DIAGNOSIS — R5382 Chronic fatigue, unspecified: Secondary | ICD-10-CM | POA: Diagnosis not present

## 2021-03-07 DIAGNOSIS — F0390 Unspecified dementia without behavioral disturbance: Secondary | ICD-10-CM | POA: Diagnosis not present

## 2021-03-07 DIAGNOSIS — M549 Dorsalgia, unspecified: Secondary | ICD-10-CM | POA: Diagnosis not present

## 2021-03-07 DIAGNOSIS — M810 Age-related osteoporosis without current pathological fracture: Secondary | ICD-10-CM | POA: Diagnosis not present

## 2021-03-07 DIAGNOSIS — R5382 Chronic fatigue, unspecified: Secondary | ICD-10-CM | POA: Diagnosis not present

## 2021-03-07 DIAGNOSIS — E538 Deficiency of other specified B group vitamins: Secondary | ICD-10-CM | POA: Diagnosis not present

## 2021-03-07 DIAGNOSIS — J309 Allergic rhinitis, unspecified: Secondary | ICD-10-CM | POA: Diagnosis not present

## 2021-03-09 DIAGNOSIS — F0390 Unspecified dementia without behavioral disturbance: Secondary | ICD-10-CM | POA: Diagnosis not present

## 2021-03-09 DIAGNOSIS — M549 Dorsalgia, unspecified: Secondary | ICD-10-CM | POA: Diagnosis not present

## 2021-03-09 DIAGNOSIS — M810 Age-related osteoporosis without current pathological fracture: Secondary | ICD-10-CM | POA: Diagnosis not present

## 2021-03-09 DIAGNOSIS — E538 Deficiency of other specified B group vitamins: Secondary | ICD-10-CM | POA: Diagnosis not present

## 2021-03-09 DIAGNOSIS — R5382 Chronic fatigue, unspecified: Secondary | ICD-10-CM | POA: Diagnosis not present

## 2021-03-09 DIAGNOSIS — J309 Allergic rhinitis, unspecified: Secondary | ICD-10-CM | POA: Diagnosis not present

## 2021-03-15 DIAGNOSIS — R5382 Chronic fatigue, unspecified: Secondary | ICD-10-CM | POA: Diagnosis not present

## 2021-03-15 DIAGNOSIS — J309 Allergic rhinitis, unspecified: Secondary | ICD-10-CM | POA: Diagnosis not present

## 2021-03-15 DIAGNOSIS — M549 Dorsalgia, unspecified: Secondary | ICD-10-CM | POA: Diagnosis not present

## 2021-03-15 DIAGNOSIS — F0390 Unspecified dementia without behavioral disturbance: Secondary | ICD-10-CM | POA: Diagnosis not present

## 2021-03-15 DIAGNOSIS — M810 Age-related osteoporosis without current pathological fracture: Secondary | ICD-10-CM | POA: Diagnosis not present

## 2021-03-15 DIAGNOSIS — E538 Deficiency of other specified B group vitamins: Secondary | ICD-10-CM | POA: Diagnosis not present

## 2021-03-16 DIAGNOSIS — M549 Dorsalgia, unspecified: Secondary | ICD-10-CM | POA: Diagnosis not present

## 2021-03-16 DIAGNOSIS — J309 Allergic rhinitis, unspecified: Secondary | ICD-10-CM | POA: Diagnosis not present

## 2021-03-16 DIAGNOSIS — F0390 Unspecified dementia without behavioral disturbance: Secondary | ICD-10-CM | POA: Diagnosis not present

## 2021-03-16 DIAGNOSIS — M810 Age-related osteoporosis without current pathological fracture: Secondary | ICD-10-CM | POA: Diagnosis not present

## 2021-03-16 DIAGNOSIS — R5382 Chronic fatigue, unspecified: Secondary | ICD-10-CM | POA: Diagnosis not present

## 2021-03-16 DIAGNOSIS — E538 Deficiency of other specified B group vitamins: Secondary | ICD-10-CM | POA: Diagnosis not present

## 2021-03-17 DIAGNOSIS — J309 Allergic rhinitis, unspecified: Secondary | ICD-10-CM | POA: Diagnosis not present

## 2021-03-17 DIAGNOSIS — E538 Deficiency of other specified B group vitamins: Secondary | ICD-10-CM | POA: Diagnosis not present

## 2021-03-17 DIAGNOSIS — M549 Dorsalgia, unspecified: Secondary | ICD-10-CM | POA: Diagnosis not present

## 2021-03-17 DIAGNOSIS — F0390 Unspecified dementia without behavioral disturbance: Secondary | ICD-10-CM | POA: Diagnosis not present

## 2021-03-17 DIAGNOSIS — R5382 Chronic fatigue, unspecified: Secondary | ICD-10-CM | POA: Diagnosis not present

## 2021-03-17 DIAGNOSIS — M810 Age-related osteoporosis without current pathological fracture: Secondary | ICD-10-CM | POA: Diagnosis not present

## 2021-03-21 DIAGNOSIS — E538 Deficiency of other specified B group vitamins: Secondary | ICD-10-CM | POA: Diagnosis not present

## 2021-03-21 DIAGNOSIS — R5382 Chronic fatigue, unspecified: Secondary | ICD-10-CM | POA: Diagnosis not present

## 2021-03-21 DIAGNOSIS — M549 Dorsalgia, unspecified: Secondary | ICD-10-CM | POA: Diagnosis not present

## 2021-03-21 DIAGNOSIS — M810 Age-related osteoporosis without current pathological fracture: Secondary | ICD-10-CM | POA: Diagnosis not present

## 2021-03-21 DIAGNOSIS — F0390 Unspecified dementia without behavioral disturbance: Secondary | ICD-10-CM | POA: Diagnosis not present

## 2021-03-21 DIAGNOSIS — J309 Allergic rhinitis, unspecified: Secondary | ICD-10-CM | POA: Diagnosis not present

## 2021-03-23 DIAGNOSIS — M810 Age-related osteoporosis without current pathological fracture: Secondary | ICD-10-CM | POA: Diagnosis not present

## 2021-03-23 DIAGNOSIS — E538 Deficiency of other specified B group vitamins: Secondary | ICD-10-CM | POA: Diagnosis not present

## 2021-03-23 DIAGNOSIS — M549 Dorsalgia, unspecified: Secondary | ICD-10-CM | POA: Diagnosis not present

## 2021-03-23 DIAGNOSIS — J309 Allergic rhinitis, unspecified: Secondary | ICD-10-CM | POA: Diagnosis not present

## 2021-03-23 DIAGNOSIS — R5382 Chronic fatigue, unspecified: Secondary | ICD-10-CM | POA: Diagnosis not present

## 2021-03-23 DIAGNOSIS — F0390 Unspecified dementia without behavioral disturbance: Secondary | ICD-10-CM | POA: Diagnosis not present

## 2021-03-24 DIAGNOSIS — M810 Age-related osteoporosis without current pathological fracture: Secondary | ICD-10-CM | POA: Diagnosis not present

## 2021-03-24 DIAGNOSIS — R5382 Chronic fatigue, unspecified: Secondary | ICD-10-CM | POA: Diagnosis not present

## 2021-03-24 DIAGNOSIS — F0390 Unspecified dementia without behavioral disturbance: Secondary | ICD-10-CM | POA: Diagnosis not present

## 2021-03-24 DIAGNOSIS — M549 Dorsalgia, unspecified: Secondary | ICD-10-CM | POA: Diagnosis not present

## 2021-03-24 DIAGNOSIS — E538 Deficiency of other specified B group vitamins: Secondary | ICD-10-CM | POA: Diagnosis not present

## 2021-03-24 DIAGNOSIS — J309 Allergic rhinitis, unspecified: Secondary | ICD-10-CM | POA: Diagnosis not present

## 2021-03-26 DIAGNOSIS — G44209 Tension-type headache, unspecified, not intractable: Secondary | ICD-10-CM | POA: Diagnosis not present

## 2021-03-26 DIAGNOSIS — M810 Age-related osteoporosis without current pathological fracture: Secondary | ICD-10-CM | POA: Diagnosis not present

## 2021-03-26 DIAGNOSIS — J309 Allergic rhinitis, unspecified: Secondary | ICD-10-CM | POA: Diagnosis not present

## 2021-03-26 DIAGNOSIS — R5382 Chronic fatigue, unspecified: Secondary | ICD-10-CM | POA: Diagnosis not present

## 2021-03-26 DIAGNOSIS — F0390 Unspecified dementia without behavioral disturbance: Secondary | ICD-10-CM | POA: Diagnosis not present

## 2021-03-26 DIAGNOSIS — M549 Dorsalgia, unspecified: Secondary | ICD-10-CM | POA: Diagnosis not present

## 2021-03-26 DIAGNOSIS — E538 Deficiency of other specified B group vitamins: Secondary | ICD-10-CM | POA: Diagnosis not present

## 2021-03-26 DIAGNOSIS — Z7982 Long term (current) use of aspirin: Secondary | ICD-10-CM | POA: Diagnosis not present

## 2021-03-26 DIAGNOSIS — Z9181 History of falling: Secondary | ICD-10-CM | POA: Diagnosis not present

## 2021-03-29 DIAGNOSIS — M810 Age-related osteoporosis without current pathological fracture: Secondary | ICD-10-CM | POA: Diagnosis not present

## 2021-03-29 DIAGNOSIS — M549 Dorsalgia, unspecified: Secondary | ICD-10-CM | POA: Diagnosis not present

## 2021-03-29 DIAGNOSIS — J309 Allergic rhinitis, unspecified: Secondary | ICD-10-CM | POA: Diagnosis not present

## 2021-03-29 DIAGNOSIS — R5382 Chronic fatigue, unspecified: Secondary | ICD-10-CM | POA: Diagnosis not present

## 2021-03-29 DIAGNOSIS — F0390 Unspecified dementia without behavioral disturbance: Secondary | ICD-10-CM | POA: Diagnosis not present

## 2021-03-29 DIAGNOSIS — E538 Deficiency of other specified B group vitamins: Secondary | ICD-10-CM | POA: Diagnosis not present

## 2021-03-30 DIAGNOSIS — E538 Deficiency of other specified B group vitamins: Secondary | ICD-10-CM | POA: Diagnosis not present

## 2021-03-30 DIAGNOSIS — F0390 Unspecified dementia without behavioral disturbance: Secondary | ICD-10-CM | POA: Diagnosis not present

## 2021-03-30 DIAGNOSIS — J309 Allergic rhinitis, unspecified: Secondary | ICD-10-CM | POA: Diagnosis not present

## 2021-03-30 DIAGNOSIS — M549 Dorsalgia, unspecified: Secondary | ICD-10-CM | POA: Diagnosis not present

## 2021-03-30 DIAGNOSIS — R5382 Chronic fatigue, unspecified: Secondary | ICD-10-CM | POA: Diagnosis not present

## 2021-03-30 DIAGNOSIS — M810 Age-related osteoporosis without current pathological fracture: Secondary | ICD-10-CM | POA: Diagnosis not present

## 2021-04-04 DIAGNOSIS — E538 Deficiency of other specified B group vitamins: Secondary | ICD-10-CM | POA: Diagnosis not present

## 2021-04-04 DIAGNOSIS — F0390 Unspecified dementia without behavioral disturbance: Secondary | ICD-10-CM | POA: Diagnosis not present

## 2021-04-04 DIAGNOSIS — J309 Allergic rhinitis, unspecified: Secondary | ICD-10-CM | POA: Diagnosis not present

## 2021-04-04 DIAGNOSIS — R5382 Chronic fatigue, unspecified: Secondary | ICD-10-CM | POA: Diagnosis not present

## 2021-04-04 DIAGNOSIS — M549 Dorsalgia, unspecified: Secondary | ICD-10-CM | POA: Diagnosis not present

## 2021-04-04 DIAGNOSIS — M810 Age-related osteoporosis without current pathological fracture: Secondary | ICD-10-CM | POA: Diagnosis not present

## 2021-04-10 DIAGNOSIS — J309 Allergic rhinitis, unspecified: Secondary | ICD-10-CM | POA: Diagnosis not present

## 2021-04-10 DIAGNOSIS — R5382 Chronic fatigue, unspecified: Secondary | ICD-10-CM | POA: Diagnosis not present

## 2021-04-10 DIAGNOSIS — M549 Dorsalgia, unspecified: Secondary | ICD-10-CM | POA: Diagnosis not present

## 2021-04-10 DIAGNOSIS — F0390 Unspecified dementia without behavioral disturbance: Secondary | ICD-10-CM | POA: Diagnosis not present

## 2021-04-10 DIAGNOSIS — E538 Deficiency of other specified B group vitamins: Secondary | ICD-10-CM | POA: Diagnosis not present

## 2021-04-10 DIAGNOSIS — M810 Age-related osteoporosis without current pathological fracture: Secondary | ICD-10-CM | POA: Diagnosis not present

## 2021-04-12 DIAGNOSIS — R5382 Chronic fatigue, unspecified: Secondary | ICD-10-CM | POA: Diagnosis not present

## 2021-04-12 DIAGNOSIS — J309 Allergic rhinitis, unspecified: Secondary | ICD-10-CM | POA: Diagnosis not present

## 2021-04-12 DIAGNOSIS — F0390 Unspecified dementia without behavioral disturbance: Secondary | ICD-10-CM | POA: Diagnosis not present

## 2021-04-12 DIAGNOSIS — M810 Age-related osteoporosis without current pathological fracture: Secondary | ICD-10-CM | POA: Diagnosis not present

## 2021-04-12 DIAGNOSIS — M549 Dorsalgia, unspecified: Secondary | ICD-10-CM | POA: Diagnosis not present

## 2021-04-12 DIAGNOSIS — E538 Deficiency of other specified B group vitamins: Secondary | ICD-10-CM | POA: Diagnosis not present

## 2021-04-18 DIAGNOSIS — M549 Dorsalgia, unspecified: Secondary | ICD-10-CM | POA: Diagnosis not present

## 2021-04-18 DIAGNOSIS — F0390 Unspecified dementia without behavioral disturbance: Secondary | ICD-10-CM | POA: Diagnosis not present

## 2021-04-18 DIAGNOSIS — R5382 Chronic fatigue, unspecified: Secondary | ICD-10-CM | POA: Diagnosis not present

## 2021-04-18 DIAGNOSIS — E538 Deficiency of other specified B group vitamins: Secondary | ICD-10-CM | POA: Diagnosis not present

## 2021-04-18 DIAGNOSIS — J309 Allergic rhinitis, unspecified: Secondary | ICD-10-CM | POA: Diagnosis not present

## 2021-04-18 DIAGNOSIS — M810 Age-related osteoporosis without current pathological fracture: Secondary | ICD-10-CM | POA: Diagnosis not present

## 2021-04-20 DIAGNOSIS — J309 Allergic rhinitis, unspecified: Secondary | ICD-10-CM | POA: Diagnosis not present

## 2021-04-20 DIAGNOSIS — R5382 Chronic fatigue, unspecified: Secondary | ICD-10-CM | POA: Diagnosis not present

## 2021-04-20 DIAGNOSIS — E538 Deficiency of other specified B group vitamins: Secondary | ICD-10-CM | POA: Diagnosis not present

## 2021-04-20 DIAGNOSIS — M549 Dorsalgia, unspecified: Secondary | ICD-10-CM | POA: Diagnosis not present

## 2021-04-20 DIAGNOSIS — M810 Age-related osteoporosis without current pathological fracture: Secondary | ICD-10-CM | POA: Diagnosis not present

## 2021-04-20 DIAGNOSIS — F0390 Unspecified dementia without behavioral disturbance: Secondary | ICD-10-CM | POA: Diagnosis not present

## 2021-04-22 DIAGNOSIS — R5382 Chronic fatigue, unspecified: Secondary | ICD-10-CM | POA: Diagnosis not present

## 2021-04-22 DIAGNOSIS — E538 Deficiency of other specified B group vitamins: Secondary | ICD-10-CM | POA: Diagnosis not present

## 2021-04-22 DIAGNOSIS — J309 Allergic rhinitis, unspecified: Secondary | ICD-10-CM | POA: Diagnosis not present

## 2021-04-22 DIAGNOSIS — F0390 Unspecified dementia without behavioral disturbance: Secondary | ICD-10-CM | POA: Diagnosis not present

## 2021-04-22 DIAGNOSIS — M549 Dorsalgia, unspecified: Secondary | ICD-10-CM | POA: Diagnosis not present

## 2021-04-22 DIAGNOSIS — M810 Age-related osteoporosis without current pathological fracture: Secondary | ICD-10-CM | POA: Diagnosis not present

## 2021-04-25 DIAGNOSIS — J309 Allergic rhinitis, unspecified: Secondary | ICD-10-CM | POA: Diagnosis not present

## 2021-04-25 DIAGNOSIS — F0390 Unspecified dementia without behavioral disturbance: Secondary | ICD-10-CM | POA: Diagnosis not present

## 2021-04-25 DIAGNOSIS — E538 Deficiency of other specified B group vitamins: Secondary | ICD-10-CM | POA: Diagnosis not present

## 2021-04-25 DIAGNOSIS — R5382 Chronic fatigue, unspecified: Secondary | ICD-10-CM | POA: Diagnosis not present

## 2021-04-25 DIAGNOSIS — M810 Age-related osteoporosis without current pathological fracture: Secondary | ICD-10-CM | POA: Diagnosis not present

## 2021-04-25 DIAGNOSIS — Z9181 History of falling: Secondary | ICD-10-CM | POA: Diagnosis not present

## 2021-04-25 DIAGNOSIS — Z7982 Long term (current) use of aspirin: Secondary | ICD-10-CM | POA: Diagnosis not present

## 2021-04-25 DIAGNOSIS — G44209 Tension-type headache, unspecified, not intractable: Secondary | ICD-10-CM | POA: Diagnosis not present

## 2021-04-25 DIAGNOSIS — M549 Dorsalgia, unspecified: Secondary | ICD-10-CM | POA: Diagnosis not present

## 2021-04-28 DIAGNOSIS — M549 Dorsalgia, unspecified: Secondary | ICD-10-CM | POA: Diagnosis not present

## 2021-04-28 DIAGNOSIS — M810 Age-related osteoporosis without current pathological fracture: Secondary | ICD-10-CM | POA: Diagnosis not present

## 2021-04-28 DIAGNOSIS — J309 Allergic rhinitis, unspecified: Secondary | ICD-10-CM | POA: Diagnosis not present

## 2021-04-28 DIAGNOSIS — R5382 Chronic fatigue, unspecified: Secondary | ICD-10-CM | POA: Diagnosis not present

## 2021-04-28 DIAGNOSIS — F0390 Unspecified dementia without behavioral disturbance: Secondary | ICD-10-CM | POA: Diagnosis not present

## 2021-04-28 DIAGNOSIS — E538 Deficiency of other specified B group vitamins: Secondary | ICD-10-CM | POA: Diagnosis not present

## 2021-05-04 DIAGNOSIS — F0390 Unspecified dementia without behavioral disturbance: Secondary | ICD-10-CM | POA: Diagnosis not present

## 2021-05-04 DIAGNOSIS — M810 Age-related osteoporosis without current pathological fracture: Secondary | ICD-10-CM | POA: Diagnosis not present

## 2021-05-04 DIAGNOSIS — M549 Dorsalgia, unspecified: Secondary | ICD-10-CM | POA: Diagnosis not present

## 2021-05-04 DIAGNOSIS — J309 Allergic rhinitis, unspecified: Secondary | ICD-10-CM | POA: Diagnosis not present

## 2021-05-04 DIAGNOSIS — E538 Deficiency of other specified B group vitamins: Secondary | ICD-10-CM | POA: Diagnosis not present

## 2021-05-04 DIAGNOSIS — R5382 Chronic fatigue, unspecified: Secondary | ICD-10-CM | POA: Diagnosis not present

## 2021-05-05 DIAGNOSIS — R5382 Chronic fatigue, unspecified: Secondary | ICD-10-CM | POA: Diagnosis not present

## 2021-05-05 DIAGNOSIS — M549 Dorsalgia, unspecified: Secondary | ICD-10-CM | POA: Diagnosis not present

## 2021-05-05 DIAGNOSIS — J309 Allergic rhinitis, unspecified: Secondary | ICD-10-CM | POA: Diagnosis not present

## 2021-05-05 DIAGNOSIS — M810 Age-related osteoporosis without current pathological fracture: Secondary | ICD-10-CM | POA: Diagnosis not present

## 2021-05-05 DIAGNOSIS — E538 Deficiency of other specified B group vitamins: Secondary | ICD-10-CM | POA: Diagnosis not present

## 2021-05-05 DIAGNOSIS — F0390 Unspecified dementia without behavioral disturbance: Secondary | ICD-10-CM | POA: Diagnosis not present

## 2021-05-09 DIAGNOSIS — F0390 Unspecified dementia without behavioral disturbance: Secondary | ICD-10-CM | POA: Diagnosis not present

## 2021-05-09 DIAGNOSIS — J309 Allergic rhinitis, unspecified: Secondary | ICD-10-CM | POA: Diagnosis not present

## 2021-05-09 DIAGNOSIS — E538 Deficiency of other specified B group vitamins: Secondary | ICD-10-CM | POA: Diagnosis not present

## 2021-05-09 DIAGNOSIS — M549 Dorsalgia, unspecified: Secondary | ICD-10-CM | POA: Diagnosis not present

## 2021-05-09 DIAGNOSIS — R5382 Chronic fatigue, unspecified: Secondary | ICD-10-CM | POA: Diagnosis not present

## 2021-05-09 DIAGNOSIS — M810 Age-related osteoporosis without current pathological fracture: Secondary | ICD-10-CM | POA: Diagnosis not present

## 2021-05-11 DIAGNOSIS — F0390 Unspecified dementia without behavioral disturbance: Secondary | ICD-10-CM | POA: Diagnosis not present

## 2021-05-11 DIAGNOSIS — M810 Age-related osteoporosis without current pathological fracture: Secondary | ICD-10-CM | POA: Diagnosis not present

## 2021-05-11 DIAGNOSIS — M549 Dorsalgia, unspecified: Secondary | ICD-10-CM | POA: Diagnosis not present

## 2021-05-11 DIAGNOSIS — J309 Allergic rhinitis, unspecified: Secondary | ICD-10-CM | POA: Diagnosis not present

## 2021-05-11 DIAGNOSIS — R5382 Chronic fatigue, unspecified: Secondary | ICD-10-CM | POA: Diagnosis not present

## 2021-05-11 DIAGNOSIS — E538 Deficiency of other specified B group vitamins: Secondary | ICD-10-CM | POA: Diagnosis not present

## 2021-05-12 ENCOUNTER — Encounter (HOSPITAL_BASED_OUTPATIENT_CLINIC_OR_DEPARTMENT_OTHER): Payer: Self-pay | Admitting: Emergency Medicine

## 2021-05-12 ENCOUNTER — Emergency Department (HOSPITAL_BASED_OUTPATIENT_CLINIC_OR_DEPARTMENT_OTHER)
Admission: EM | Admit: 2021-05-12 | Discharge: 2021-05-12 | Disposition: A | Discharge status: Home / Self Care | Payer: Medicare Other | Attending: Emergency Medicine | Admitting: Emergency Medicine

## 2021-05-12 ENCOUNTER — Emergency Department (HOSPITAL_BASED_OUTPATIENT_CLINIC_OR_DEPARTMENT_OTHER): Payer: Medicare Other

## 2021-05-12 ENCOUNTER — Other Ambulatory Visit: Payer: Self-pay

## 2021-05-12 DIAGNOSIS — M25552 Pain in left hip: Secondary | ICD-10-CM | POA: Insufficient documentation

## 2021-05-12 DIAGNOSIS — Z853 Personal history of malignant neoplasm of breast: Secondary | ICD-10-CM | POA: Diagnosis not present

## 2021-05-12 DIAGNOSIS — Z7982 Long term (current) use of aspirin: Secondary | ICD-10-CM | POA: Diagnosis not present

## 2021-05-12 DIAGNOSIS — M79605 Pain in left leg: Secondary | ICD-10-CM | POA: Diagnosis not present

## 2021-05-12 MED ORDER — IBUPROFEN 200 MG PO TABS
200.0000 mg | ORAL_TABLET | Freq: Once | ORAL | Status: AC
  Administered 2021-05-12: 200 mg via ORAL
  Filled 2021-05-12: qty 1

## 2021-05-12 NOTE — ED Triage Notes (Addendum)
Pt presents to ED BIB [facility] from [facility], independent living. Pt reports that she stood up after eating dinner and pain began starting at L hip and radiating down. Pt denies injury/insult. Pt ambulated from EMS stretcher to ED bed

## 2021-05-12 NOTE — Discharge Instructions (Addendum)
Call your primary care doctor or specialist as discussed in the next 2-3 days.   Return immediately back to the ER if:  Your symptoms worsen within the next 12-24 hours. You develop new symptoms such as new fevers, persistent vomiting, new pain, shortness of breath, or new weakness or numbness, or if you have any other concerns.  

## 2021-05-12 NOTE — ED Provider Notes (Signed)
[facility] DEPT Provider Note   CSN: [id number] Arrival date & time: 05/12/21  1922     History Chief Complaint  Patient presents with   Leg Pain    Melody Wilson is a 87 y.o. female.  Patient presents with sudden onset left hip pain radiating down the left leg.  She states that she lives in independent living and she was try to stand up after having dinner when she felt sudden sharp pain in her left hip region rating down her leg.  Denies fall or trauma.  Denies fevers or cough or vomiting or diarrhea.  She states currently the pain is not there but when she tries to stand up and walk she will have sharper pain.      Past Medical History:  Diagnosis Date   Arthritis    Cancer of breast ([place]) 2000   Right, IDC   Personal history of malignant neoplasm of breast 08/02/1999   Stage 1 IDC, right, central, receptor+, Her2 neg, treated with central partial mastectomy, SLN and radiation     Patient Active Problem List   Diagnosis Date Noted   Chronic tension-type headache 11/16/2020   Chronic fatigue syndrome 11/16/2020   Pure hypercholesterolemia 11/16/2020   Recurrent major depression in remission ([place]) 11/16/2020   Osteoporosis 11/16/2020   Mild neurocognitive disorder due to Alzheimer's disease ([place]) 11/16/2020   Personal history of malignant neoplasm of breast 08/02/1999    Past Surgical History:  Procedure Laterality Date   BREAST EXCISIONAL BIOPSY     BREAST LUMPECTOMY     BREAST SURGERY Right 2000   right central mastectomy   CATARACT EXTRACTION       OB History   No obstetric history on file.     Family History  Problem Relation Age of Onset   Cancer Sister        Breast    Social History   Tobacco Use   Smoking status: Never   Smokeless tobacco: Never  Vaping Use   Vaping Use: Never used  Substance Use Topics   Alcohol use: No   Drug use: No    Home Medications Prior to Admission medications   Medication Sig Start  Date End Date Taking? Authorizing Provider  aspirin 81 MG tablet Take 81 mg by mouth daily.    [provider]  donepezil (ARICEPT) 10 MG tablet Take 1 tablet daily 11/16/20   Cameron Sprang, MD  gabapentin (NEURONTIN) 100 MG capsule Take 1 capsule (100 mg total) by mouth at bedtime. 11/16/20   Cameron Sprang, MD  Multiple Vitamins-Minerals (MULTIVITAMIN WITH MINERALS) tablet Take 1 tablet by mouth daily.    [provider]  naproxen sodium (ANAPROX) 220 MG tablet Take 220 mg by mouth 2 (two) times daily with a meal.    [provider]  vitamin C (ASCORBIC ACID) 500 MG tablet Take 500 mg by mouth daily.    [provider]  vitamin E 400 UNIT capsule Take 400 Units by mouth daily.    [provider]    Allergies    Tylenol [acetaminophen]  Review of Systems   Review of Systems  Constitutional:  Negative for fever.  HENT:  Negative for ear pain.   Eyes:  Negative for pain.  Respiratory:  Negative for cough.   Cardiovascular:  Negative for chest pain.  Gastrointestinal:  Negative for abdominal pain.  Genitourinary:  Negative for flank pain.  Musculoskeletal:  Negative for back pain.  Skin:  Negative for rash.  Neurological:  Negative for headaches.   Physical Exam Updated Vital Signs BP (!) 145/71 (BP Location: Right Arm)   Pulse 65   Temp 98 F (36.7 C) (Oral)   Resp 18   Ht 5' 4"  (1.626 m)   Wt 62 kg   SpO2 99%   BMI 23.46 kg/m   Physical Exam Constitutional:      General: She is not in acute distress.    Appearance: Normal appearance.  HENT:     Head: Normocephalic.     Nose: Nose normal.  Eyes:     Extraocular Movements: Extraocular movements intact.  Cardiovascular:     Rate and Rhythm: Normal rate.  Pulmonary:     Effort: Pulmonary effort is normal.  Musculoskeletal:        General: Normal range of motion.     Cervical back: Normal range of motion.     Comments: Normal range of motion of bilateral lower extremity  hips knees and ankles without pain or discomfort.  No T or C or L-spine midline step-offs or tenderness noted.  Neurological:     General: No focal deficit present.     Mental Status: She is alert. Mental status is at baseline.     Cranial Nerves: No cranial nerve deficit.     Motor: No weakness.    ED Results / Procedures / Treatments   Labs (all labs ordered are listed, but only abnormal results are displayed) Labs Reviewed - No data to display  EKG None  Radiology DG HIP UNILAT WITH PELVIS 2-3 VIEWS LEFT  Result Date: 05/12/2021 CLINICAL DATA:  Left hip pain without known injury. EXAM: DG HIP (WITH OR WITHOUT PELVIS) 2-3V LEFT COMPARISON:  April 11, 2005. FINDINGS: There is no evidence of hip fracture or dislocation. There is no evidence of arthropathy or other focal bone abnormality. IMPRESSION: Negative. Electronically Signed   By: Marijo Conception M.D.   On: 05/12/2021 20:50    Procedures Procedures   Medications Ordered in ED Medications  ibuprofen (ADVIL) tablet 200 mg (200 mg Oral Given 05/12/21 1958)    ED Course  I have reviewed the triage vital signs and the nursing notes.  Pertinent labs & imaging results that were available during my care of the patient were reviewed by me and considered in my medical decision making (see chart for details).    MDM Rules/Calculators/A&P                           Patient states her symptoms have resolved and she no longer has any pain.  I am unclear as to what has caused her pain initially.  X-rays ordered of the affected limb which are unremarkable for any fracture or acute finding.  Patient remains symptom-free at this time.  Discharged home in stable condition advised follow-up with her doctor within the week.  Advised immediate return for recurrent pain worsening symptoms or any additional concerns. Final Clinical Impression(s) / ED Diagnoses Final diagnoses:  Left hip pain    Rx / DC Orders ED Discharge Orders      None        Luna Fuse, MD 05/12/21 2109

## 2021-05-12 NOTE — ED Notes (Signed)
Discharge instructions discussed with pt and son. Pt and son verbalized understanding. Pt stable and ambulatory. Discussed with son via phone. Sone to puck pt up

## 2021-05-16 DIAGNOSIS — M549 Dorsalgia, unspecified: Secondary | ICD-10-CM | POA: Diagnosis not present

## 2021-05-16 DIAGNOSIS — M810 Age-related osteoporosis without current pathological fracture: Secondary | ICD-10-CM | POA: Diagnosis not present

## 2021-05-16 DIAGNOSIS — R5382 Chronic fatigue, unspecified: Secondary | ICD-10-CM | POA: Diagnosis not present

## 2021-05-16 DIAGNOSIS — E538 Deficiency of other specified B group vitamins: Secondary | ICD-10-CM | POA: Diagnosis not present

## 2021-05-16 DIAGNOSIS — F0390 Unspecified dementia without behavioral disturbance: Secondary | ICD-10-CM | POA: Diagnosis not present

## 2021-05-16 DIAGNOSIS — J309 Allergic rhinitis, unspecified: Secondary | ICD-10-CM | POA: Diagnosis not present

## 2021-05-19 ENCOUNTER — Ambulatory Visit: Payer: Medicare Other | Admitting: Physician Assistant

## 2021-05-24 DIAGNOSIS — F0390 Unspecified dementia without behavioral disturbance: Secondary | ICD-10-CM | POA: Diagnosis not present

## 2021-05-24 DIAGNOSIS — M810 Age-related osteoporosis without current pathological fracture: Secondary | ICD-10-CM | POA: Diagnosis not present

## 2021-05-24 DIAGNOSIS — M25551 Pain in right hip: Secondary | ICD-10-CM | POA: Diagnosis not present

## 2021-05-24 DIAGNOSIS — D692 Other nonthrombocytopenic purpura: Secondary | ICD-10-CM | POA: Diagnosis not present

## 2021-05-24 DIAGNOSIS — E7800 Pure hypercholesterolemia, unspecified: Secondary | ICD-10-CM | POA: Diagnosis not present

## 2021-05-24 DIAGNOSIS — B351 Tinea unguium: Secondary | ICD-10-CM | POA: Diagnosis not present

## 2021-05-24 DIAGNOSIS — Z79899 Other long term (current) drug therapy: Secondary | ICD-10-CM | POA: Diagnosis not present

## 2021-05-24 DIAGNOSIS — Z0000 Encounter for general adult medical examination without abnormal findings: Secondary | ICD-10-CM | POA: Diagnosis not present

## 2021-05-24 DIAGNOSIS — J309 Allergic rhinitis, unspecified: Secondary | ICD-10-CM | POA: Diagnosis not present

## 2021-05-24 DIAGNOSIS — F3341 Major depressive disorder, recurrent, in partial remission: Secondary | ICD-10-CM | POA: Diagnosis not present

## 2021-06-02 DIAGNOSIS — R41841 Cognitive communication deficit: Secondary | ICD-10-CM | POA: Diagnosis not present

## 2021-06-02 DIAGNOSIS — F0390 Unspecified dementia without behavioral disturbance: Secondary | ICD-10-CM | POA: Diagnosis not present

## 2021-06-06 DIAGNOSIS — R41841 Cognitive communication deficit: Secondary | ICD-10-CM | POA: Diagnosis not present

## 2021-06-06 DIAGNOSIS — F0390 Unspecified dementia without behavioral disturbance: Secondary | ICD-10-CM | POA: Diagnosis not present

## 2021-06-07 DIAGNOSIS — F0390 Unspecified dementia without behavioral disturbance: Secondary | ICD-10-CM | POA: Diagnosis not present

## 2021-06-07 DIAGNOSIS — R41841 Cognitive communication deficit: Secondary | ICD-10-CM | POA: Diagnosis not present

## 2021-06-08 DIAGNOSIS — M5442 Lumbago with sciatica, left side: Secondary | ICD-10-CM | POA: Diagnosis not present

## 2021-06-09 DIAGNOSIS — R41841 Cognitive communication deficit: Secondary | ICD-10-CM | POA: Diagnosis not present

## 2021-06-09 DIAGNOSIS — F0390 Unspecified dementia without behavioral disturbance: Secondary | ICD-10-CM | POA: Diagnosis not present

## 2021-06-12 DIAGNOSIS — F0390 Unspecified dementia without behavioral disturbance: Secondary | ICD-10-CM | POA: Diagnosis not present

## 2021-06-12 DIAGNOSIS — R41841 Cognitive communication deficit: Secondary | ICD-10-CM | POA: Diagnosis not present

## 2021-06-13 DIAGNOSIS — R41841 Cognitive communication deficit: Secondary | ICD-10-CM | POA: Diagnosis not present

## 2021-06-13 DIAGNOSIS — F0390 Unspecified dementia without behavioral disturbance: Secondary | ICD-10-CM | POA: Diagnosis not present

## 2021-06-15 DIAGNOSIS — F0390 Unspecified dementia without behavioral disturbance: Secondary | ICD-10-CM | POA: Diagnosis not present

## 2021-06-15 DIAGNOSIS — R41841 Cognitive communication deficit: Secondary | ICD-10-CM | POA: Diagnosis not present

## 2021-06-20 ENCOUNTER — Ambulatory Visit (INDEPENDENT_AMBULATORY_CARE_PROVIDER_SITE_OTHER): Payer: Medicare Other | Admitting: Physician Assistant

## 2021-06-20 ENCOUNTER — Other Ambulatory Visit: Payer: Self-pay

## 2021-06-20 ENCOUNTER — Encounter: Payer: Self-pay | Admitting: Physician Assistant

## 2021-06-20 VITALS — BP 109/68 | HR 82 | Resp 18 | Ht <= 58 in | Wt 140.0 lb

## 2021-06-20 DIAGNOSIS — R41841 Cognitive communication deficit: Secondary | ICD-10-CM | POA: Diagnosis not present

## 2021-06-20 DIAGNOSIS — G309 Alzheimer's disease, unspecified: Secondary | ICD-10-CM

## 2021-06-20 DIAGNOSIS — F0670 Mild neurocognitive disorder due to known physiological condition without behavioral disturbance: Secondary | ICD-10-CM

## 2021-06-20 DIAGNOSIS — F0390 Unspecified dementia without behavioral disturbance: Secondary | ICD-10-CM | POA: Diagnosis not present

## 2021-06-20 MED ORDER — DONEPEZIL HCL 10 MG PO TABS: ORAL_TABLET | ORAL | 11 refills | Status: DC

## 2021-06-20 MED ORDER — MEMANTINE HCL 10 MG PO TABS: ORAL_TABLET | ORAL | 11 refills | Status: DC

## 2021-06-20 NOTE — Patient Instructions (Signed)
It was a pleasure to see you today at our office.   Recommendations:  Meds: Follow up in  6 months Continue donepezil 10 mg daily. Side effects were discussed  Start Memantine 10mg  tablets.  Take 1/2  tablet at bedtime for 2 weeks, then 1 tablet nightly.  Side effects include dizziness, headache, diarrhea or constipation.  Call with any questions or concerns.    RECOMMENDATIONS FOR ALL PATIENTS WITH MEMORY PROBLEMS: 1. Continue to exercise (Recommend 30 minutes of walking everyday, or 3 hours every week) 2. Increase social interactions - continue going to [place] and enjoy social gatherings with friends and family 3. Eat healthy, avoid fried foods and eat more fruits and vegetables 4. Maintain adequate blood pressure, blood sugar, and blood cholesterol level. Reducing the risk of stroke and cardiovascular disease also helps promoting better memory. 5. Avoid stressful situations. Live a simple life and avoid aggravations. Organize your time and prepare for the next day in anticipation. 6. Sleep well, avoid any interruptions of sleep and avoid any distractions in the bedroom that may interfere with adequate sleep quality 7. Avoid sugar, avoid sweets as there is a strong link between excessive sugar intake, diabetes, and cognitive impairment We discussed the Mediterranean diet, which has been shown to help patients reduce the risk of progressive memory disorders and reduces cardiovascular risk. This includes eating fish, eat fruits and green leafy vegetables, nuts like almonds and hazelnuts, walnuts, and also use olive oil. Avoid fast foods and fried foods as much as possible. Avoid sweets and sugar as sugar use has been linked to worsening of memory function.  There is always a concern of gradual progression of memory problems. If this is the case, then we may need to adjust level of care according to patient needs. Support, both to the patient and caregiver, should then be put into place.    The  Alzheimer[employer] Association is here all day, every day for people facing Alzheimer's disease through our free 24/7 Helpline: 619-880-6553. The Helpline provides reliable information and support to all those who need assistance, such as individuals living with memory loss, Alzheimer's or other dementia, caregivers, health care professionals and the public.  Our highly trained and knowledgeable staff can help you with: Understanding memory loss, dementia and Alzheimer's  Medications and other treatment options  General information about aging and brain health  Skills to provide quality care and to find the best care from professionals  Legal, financial and living-arrangement decisions Our Helpline also features: Confidential care consultation provided by master's level clinicians who can help with decision-making support, crisis assistance and education on issues families face every day  Help in a caller's preferred language using our translation service that features more than 200 languages and dialects  Referrals to local community programs, services and ongoing support     FALL PRECAUTIONS: Be cautious when walking. Scan the area for obstacles that may increase the risk of trips and falls. When getting up in the mornings, sit up at the edge of the bed for a few minutes before getting out of bed. Consider elevating the bed at the head end to avoid drop of blood pressure when getting up. Walk always in a well-lit room (use night lights in the walls). Avoid area rugs or power cords from appliances in the middle of the walkways. Use a walker or a cane if necessary and consider physical therapy for balance exercise. Get your eyesight checked regularly.  FINANCIAL OVERSIGHT: Supervision, especially oversight when making  financial decisions or transactions is also recommended.  HOME SAFETY: Consider the safety of the kitchen when operating appliances like stoves, microwave oven, and blender. Consider having  supervision and share cooking responsibilities until no longer able to participate in those. Accidents with firearms and other hazards in the house should be identified and addressed as well.   ABILITY TO BE LEFT ALONE: If patient is unable to contact 911 operator, consider using LifeLine, or when the need is there, arrange for someone to stay with patients. Smoking is a fire hazard, consider supervision or cessation. Risk of wandering should be assessed by caregiver and if detected at any point, supervision and safe proof recommendations should be instituted.  MEDICATION SUPERVISION: Inability to self-administer medication needs to be constantly addressed. Implement a mechanism to ensure safe administration of the medications.         Mediterranean Diet A Mediterranean diet refers to food and lifestyle choices that are based on the traditions of countries located on the [employer]. This way of eating has been shown to help prevent certain conditions and improve outcomes for people who have chronic diseases, like kidney disease and heart disease. What are tips for following this plan? Lifestyle  Cook and eat meals together with your family, when possible. Drink enough fluid to keep your urine clear or pale yellow. Be physically active every day. This includes: Aerobic exercise like running or swimming. Leisure activities like gardening, walking, or housework. Get 7-8 hours of sleep each night. If recommended by your health care provider, drink red wine in moderation. This means 1 glass a day for nonpregnant women and 2 glasses a day for men. A glass of wine equals 5 oz (150 mL). Reading food labels  Check the serving size of packaged foods. For foods such as rice and pasta, the serving size refers to the amount of cooked product, not dry. Check the total fat in packaged foods. Avoid foods that have saturated fat or trans fats. Check the ingredients list for added sugars, such as corn  syrup. Shopping  At the grocery store, buy most of your food from the areas near the walls of the store. This includes: Fresh fruits and vegetables (produce). Grains, beans, nuts, and seeds. Some of these may be available in unpackaged forms or large amounts (in bulk). Fresh seafood. Poultry and eggs. Low-fat dairy products. Buy whole ingredients instead of prepackaged foods. Buy fresh fruits and vegetables in-season from local farmers markets. Buy frozen fruits and vegetables in resealable bags. If you do not have access to quality fresh seafood, buy precooked frozen shrimp or canned fish, such as tuna, salmon, or sardines. Buy small amounts of raw or cooked vegetables, salads, or olives from the deli or salad bar at your store. Stock your pantry so you always have certain foods on hand, such as olive oil, canned tuna, canned tomatoes, rice, pasta, and beans. Cooking  Cook foods with extra-virgin olive oil instead of using butter or other vegetable oils. Have meat as a side dish, and have vegetables or grains as your main dish. This means having meat in small portions or adding small amounts of meat to foods like pasta or stew. Use beans or vegetables instead of meat in common dishes like chili or lasagna. Experiment with different cooking methods. Try roasting or broiling vegetables instead of steaming or sauteing them. Add frozen vegetables to soups, stews, pasta, or rice. Add nuts or seeds for added healthy fat at each meal. You can add  these to yogurt, salads, or vegetable dishes. Marinate fish or vegetables using olive oil, lemon juice, garlic, and fresh herbs. Meal planning  Plan to eat 1 vegetarian meal one day each week. Try to work up to 2 vegetarian meals, if possible. Eat seafood 2 or more times a week. Have healthy snacks readily available, such as: Vegetable sticks with hummus. Greek yogurt. Fruit and nut trail mix. Eat balanced meals throughout the week. This  includes: Fruit: 2-3 servings a day Vegetables: 4-5 servings a day Low-fat dairy: 2 servings a day Fish, poultry, or lean meat: 1 serving a day Beans and legumes: 2 or more servings a week Nuts and seeds: 1-2 servings a day Whole grains: 6-8 servings a day Extra-virgin olive oil: 3-4 servings a day Limit red meat and sweets to only a few servings a month What are my food choices? Mediterranean diet Recommended Grains: Whole-grain pasta. Brown rice. Bulgar wheat. Polenta. Couscous. Whole-wheat bread. [place]. Vegetables: Artichokes. Beets. Broccoli. Cabbage. Carrots. Eggplant. Green beans. Chard. Kale. Spinach. Onions. Leeks. Peas. Squash. Tomatoes. Peppers. Radishes. Fruits: Apples. Apricots. Avocado. Berries. Bananas. Cherries. Dates. Figs. Grapes. Lemons. Melon. Oranges. Peaches. Plums. Pomegranate. Meats and other protein foods: Beans. Almonds. Sunflower seeds. Pine nuts. Peanuts. [place]. Salmon. Scallops. Shrimp. [place]. Tilapia. Clams. Oysters. Eggs. Dairy: Low-fat milk. Cheese. Greek yogurt. Beverages: Water. Red wine. Herbal tea. Fats and oils: Extra virgin olive oil. Avocado oil. Grape seed oil. Sweets and desserts: [place] yogurt with honey. Baked apples. Poached pears. Trail mix. Seasoning and other foods: Basil. Cilantro. Coriander. Cumin. Mint. Parsley. Sage. Rosemary. Tarragon. Garlic. Oregano. Thyme. Pepper. Balsalmic vinegar. Tahini. Hummus. Tomato sauce. Olives. Mushrooms. Limit these Grains: Prepackaged pasta or rice dishes. Prepackaged cereal with added sugar. Vegetables: Deep fried potatoes (french fries). Fruits: Fruit canned in syrup. Meats and other protein foods: Beef. Pork. Lamb. Poultry with skin. Hot dogs. [place] Salines. Dairy: Ice cream. Sour cream. Whole milk. Beverages: Juice. Sugar-sweetened soft drinks. Beer. Liquor and spirits. Fats and oils: Butter. Canola oil. Vegetable oil. Beef fat (tallow). Lard. Sweets and desserts: Cookies. Cakes. Pies. Candy. Seasoning  and other foods: Mayonnaise. Premade sauces and marinades. The items listed may not be a complete list. Talk with your dietitian about what dietary choices are right for you. Summary The Mediterranean diet includes both food and lifestyle choices. Eat a variety of fresh fruits and vegetables, beans, nuts, seeds, and whole grains. Limit the amount of red meat and sweets that you eat. Talk with your health care provider about whether it is safe for you to drink red wine in moderation. This means 1 glass a day for nonpregnant women and 2 glasses a day for men. A glass of wine equals 5 oz (150 mL). This information is not intended to replace advice given to you by your health care provider. Make sure you discuss any questions you have with your health care provider. Document Released: 03/29/2016 Document Revised: 05/01/2016 Document Reviewed: 03/29/2016 Elsevier Interactive Patient Education  2017 [employer].

## 2021-06-20 NOTE — Progress Notes (Addendum)
Assessment/Plan:    Late onset Alzheimer's dementia, mild without behavioral disturbance  Today, her MMSE is 20/30, compared to  one year ago that was 22/30.  Currently, she is on donepezil 10 mg daily. Exam is normal.    Recommendations:  Discussed safety both in and out of the home.  Discussed the importance of regular daily schedule with inclusion of crossword puzzles to maintain brain function.  Continue to monitor mood by PCP Stay active at least 30 minutes at least 3 times a week.  Naps should be scheduled and should be no longer than 60 minutes and should not occur after 2 PM.  Mediterranean diet is recommended  Continue donepezil 10 mg daily Side effects were discussed Will add memantine 10 mg nightly: Take half dose, 5 mg for 2 weeks, then increase to 10 mg nightly.  As she administers her medications, and she misses doses, her son requested that the patient continues to take medications once a day instead of twice daily, does, these adjustments are being made.  If memory becomes worse, the son agreed to increase it to the twice daily standard dose. Follow up in 6 months.   Case discussed with Dr. Delice Lesch who agrees with the plan     Subjective:   ED visits since last seen: L hip pain, no fx 05/12/21 Hospital admissions: none  Melody Wilson is a 87 y.o. right-handed female  seen today in follow up for memory loss. This patient is accompanied in the office by her son who supplements the history.  Previous records as well as any outside records available were reviewed prior to todays visit.  Patient is currently on donepezil 10 mg daily.  Since last visit, the patient has moved to heart a morning in June.  Since then, she has been walking, enjoying interaction with other residents, she likes to "watch and doing the puzzles, but not doing them ".  She no longer drives. Son reports that her  memory is worse, especially is harder for her to remember to take the medication, she does  not want to.  She no longer cooks.  Her appetite is slightly improved, has gained a few pounds.  She continues to not drink enough water.  She showers frequently, and dresses herself.  She does misplace objects, but quickly finds them.  She denies any visual or auditory hallucinations, but she does not like to think about remote situations, as they are traumatic.  She has occasional nightmares, which they feel real to her.  She was taking gabapentin for headaches, but in view of missing many medications, her PCP discontinued gabapentin.  She has not complained of headaches recently. Denies headaches, double vision, dizziness, focal numbness or tingling, unilateral weakness or tremors or anosmia. No history of seizures. Denies urine incontinence, retention, constipation or diarrhea.   "HISTORY OF PRESENT ILLNESS (from 04/15/2020):  This is a pleasant 87 year old right-handed woman with a history of breast cancer presenting for evaluation of dementia. Her 2 sons Shanon Brow and Mauricio Po are present and provided additional information privately as well. The patient feels her memory is "ok for my age." Her sons started noticing short-term memory changes for the past few years, but more concerning recently. She forgets dates and appointments. Bills were not getting paid, her sons report partly due to being unable to get to places to physically pay them being a challenge. She had not paid the homeowners fee last May. She gets confused when he asks her  questions. She gets frustrated and would shut down, not wanting to talk. She continues to drive locally and denies getting lost, sons have not gotten any calls about this. They state she hardly leaves the house. She is not taking any regular medications. She used to have Tramadol for her back pain but she had started taking it daily so her sons took it out of the house. She takes Aleve prn. She does not cook as much and denies leaving the stove on. She has noticed she misplaces  things, she has to double check things more. Her sons report she is writing things down and making lists. She is independent with dressing and bathing, no hygiene concerns, house is taken care of. No paranoia or hallucinations. There was only one incident in the early Spring where she was convinced David's neighbor left a pile of wood in her neighbor's lawn, when he did not. No family history of dementia. No history of concussions or alcohol use.    She has had a daily headache for the past couple of months. She reports an achy pain on the vertex, like a headband, lasting a few hours. She does not take the Aleve daily for the headaches. There is no associated nausea/vomiting, photo/phonophobia, vision changes. She denies any dizziness, diplopia, dysarthria/dysphagia, bowel/bladder dysfunction, anosmia, or tremors. She reports pain going down her left arm and arthritis in both hands. Sleep is good, no wandering behavior. No falls. "    Neurocognitive Feedback Note (Dr. Alphonzo Severance, 05/26/20) :   "..We discussed impression of mild stage dementia. We had a long discussion about necessary supports at this stage of the disease, which in Ms. Bogle's case is mostly already being done, such as finance and medications. We also discussed settings that are appropriate for individuals with mild stage AD, including home with some assistance, assisted living, and independent living. Ms. Petterson son's are concerned that she is quite isolated although she is hesitant to leave the home, and we processed that. Marland Kitchen.There are no safety concerns at present."     PREVIOUS MEDICATIONS:   CURRENT MEDICATIONS:  Outpatient Encounter Medications as of 06/20/2021  Medication Sig   aspirin 81 MG tablet Take 81 mg by mouth daily.   memantine (NAMENDA) 10 MG tablet Take half tablet (5mg  at night) for 2 weeks, then increase to 1 tablet (10 mg) nightly   Multiple Vitamins-Minerals (MULTIVITAMIN WITH MINERALS) tablet Take 1 tablet by  mouth daily.   naproxen sodium (ANAPROX) 220 MG tablet Take 220 mg by mouth 2 (two) times daily with a meal.   vitamin C (ASCORBIC ACID) 500 MG tablet Take 500 mg by mouth daily.   vitamin E 400 UNIT capsule Take 400 Units by mouth daily.   [DISCONTINUED] donepezil (ARICEPT) 10 MG tablet Take 1 tablet daily   donepezil (ARICEPT) 10 MG tablet Take 1 tablet daily   gabapentin (NEURONTIN) 100 MG capsule Take 1 capsule (100 mg total) by mouth at bedtime.   No facility-administered encounter medications on file as of 06/20/2021.     Objective:     PHYSICAL EXAMINATION:    VITALS:   Vitals:   06/20/21 1531  BP: 109/68  Pulse: 82  Resp: 18  SpO2: 98%  Weight: 140 lb (63.5 kg)  Height: 4\' 9"  (1.448 m)    GEN:  The patient appears stated age and is in NAD. HEENT:  Normocephalic, atraumatic.   Neurological examination:  General: NAD, well-groomed, appears stated age. Orientation: The patient is alert.  Oriented to person, place, but not to date.   Cranial nerves: There is good facial symmetry. The speech is fluent and clear. No aphasia or dysarthria. Fund of knowledge is appropriate. Recent and remote memory are impaired. Attention and concentration are reduced.  Able to name objects 2/3 and repeat phrases.  Hearing is intact to conversational tone.   Delayed recall 0/3   Date Sept 22, 2022 Sensation: Sensation is intact to light touch throughout Motor: Strength is at least antigravity x4. Tremors: none  DTR's 2/4 in UE/LE    No flowsheet data found. MMSE - Mini Mental State Exam 06/21/2021 05/09/2020  Orientation to time 3 4  Orientation to Place 5 4  Registration 3 3  Attention/ Calculation 2 5  Recall 0 0  Language- name 2 objects 2 1  Language- repeat 1 0  Language- follow 3 step command 2 3  Language- read & follow direction 1 1  Write a sentence 1 1  Copy design 0 0  Total score 20 22    [employer] Mental Exam 04/15/2020  Weekday Correct 0  Current year 0   What state are we in? 1  Amount spent 0  Amount left 0  # of Animals 2  5 objects recall 0  Number series 2  Hour markers 2  Time correct 0  Placed X in triangle correctly 1  Largest Figure 1  Name of female 0  Date back to work 0  Type of work 0  State she lived in 0  Total score 9       Movement examination: Tone: There is normal tone in the UE/LE Abnormal movements:  no tremor.  No myoclonus.  No asterixis.   Coordination:  There is no decremation with RAM's. Normal finger to nose  Gait and Station: The patient has no difficulty arising out of a deep-seated chair without the use of the hands. The patient's stride length is good.  Gait is cautious and narrow.      Total time spent on today's visit was minutes, including both face-to-face time and nonface-to-face time. Time included that spent on review of records (prior notes available to me/labs/imaging if pertinent), discussing treatment and goals, answering patient's questions and coordinating care.  Cc:  Maurice Small, MD (Inactive) Sharene Butters, PA-C

## 2021-06-21 ENCOUNTER — Ambulatory Visit (INDEPENDENT_AMBULATORY_CARE_PROVIDER_SITE_OTHER): Payer: Medicare Other | Admitting: Podiatry

## 2021-06-21 ENCOUNTER — Encounter: Payer: Self-pay | Admitting: Podiatry

## 2021-06-21 DIAGNOSIS — F0390 Unspecified dementia without behavioral disturbance: Secondary | ICD-10-CM | POA: Diagnosis not present

## 2021-06-21 DIAGNOSIS — M79674 Pain in right toe(s): Secondary | ICD-10-CM

## 2021-06-21 DIAGNOSIS — B351 Tinea unguium: Secondary | ICD-10-CM | POA: Diagnosis not present

## 2021-06-21 DIAGNOSIS — R41841 Cognitive communication deficit: Secondary | ICD-10-CM | POA: Diagnosis not present

## 2021-06-21 DIAGNOSIS — M79675 Pain in left toe(s): Secondary | ICD-10-CM

## 2021-06-21 NOTE — Progress Notes (Addendum)
This patient presents to the office with chief complaint of long thick painful nails.  Patient says the nails are painful walking and wearing shoes.  This patient is unable to self treat.  This patient is unable to trim her nails since she is unable to reach her nails. She presents to the office with her daughter. She presents to the office for preventative foot care services. She presents to the office with her daughter.  General Appearance  Alert, conversant and in no acute stress.  Vascular  Dorsalis pedis and posterior tibial  pulses are weakly  palpable  bilaterally.  Capillary return is within normal limits  bilaterally. Temperature is within normal limits  bilaterally.  Neurologic  Senn-Weinstein monofilament wire test within normal limits  bilaterally. Muscle power within normal limits bilaterally.  Nails Thick disfigured discolored nails with subungual debris  from hallux to fifth toes bilaterally. No evidence of bacterial infection or drainage bilaterally.  Orthopedic  No limitations of motion  feet .  No crepitus or effusions noted.  No bony pathology or digital deformities noted.  Skin  normotropic skin with no porokeratosis noted bilaterally.  No signs of infections or ulcers noted.     Onychomycosis  Nails  B/L.  Pain in right toes  Pain in left toes  Debridement of nails both feet followed trimming the nails with dremel tool.    RTC 3 months.   Gardiner Barefoot DPM

## 2021-06-22 DIAGNOSIS — F0390 Unspecified dementia without behavioral disturbance: Secondary | ICD-10-CM | POA: Diagnosis not present

## 2021-06-22 DIAGNOSIS — R41841 Cognitive communication deficit: Secondary | ICD-10-CM | POA: Diagnosis not present

## 2021-06-26 DIAGNOSIS — R41841 Cognitive communication deficit: Secondary | ICD-10-CM | POA: Diagnosis not present

## 2021-06-26 DIAGNOSIS — F0390 Unspecified dementia without behavioral disturbance: Secondary | ICD-10-CM | POA: Diagnosis not present

## 2021-06-28 DIAGNOSIS — F0390 Unspecified dementia without behavioral disturbance: Secondary | ICD-10-CM | POA: Diagnosis not present

## 2021-06-28 DIAGNOSIS — R41841 Cognitive communication deficit: Secondary | ICD-10-CM | POA: Diagnosis not present

## 2021-06-29 DIAGNOSIS — F0390 Unspecified dementia without behavioral disturbance: Secondary | ICD-10-CM | POA: Diagnosis not present

## 2021-06-29 DIAGNOSIS — R41841 Cognitive communication deficit: Secondary | ICD-10-CM | POA: Diagnosis not present

## 2021-07-03 DIAGNOSIS — F0390 Unspecified dementia without behavioral disturbance: Secondary | ICD-10-CM | POA: Diagnosis not present

## 2021-07-03 DIAGNOSIS — R41841 Cognitive communication deficit: Secondary | ICD-10-CM | POA: Diagnosis not present

## 2021-07-05 DIAGNOSIS — R41841 Cognitive communication deficit: Secondary | ICD-10-CM | POA: Diagnosis not present

## 2021-07-05 DIAGNOSIS — F0390 Unspecified dementia without behavioral disturbance: Secondary | ICD-10-CM | POA: Diagnosis not present

## 2021-07-06 DIAGNOSIS — F0390 Unspecified dementia without behavioral disturbance: Secondary | ICD-10-CM | POA: Diagnosis not present

## 2021-07-06 DIAGNOSIS — R41841 Cognitive communication deficit: Secondary | ICD-10-CM | POA: Diagnosis not present

## 2021-07-10 DIAGNOSIS — F0390 Unspecified dementia without behavioral disturbance: Secondary | ICD-10-CM | POA: Diagnosis not present

## 2021-07-10 DIAGNOSIS — R41841 Cognitive communication deficit: Secondary | ICD-10-CM | POA: Diagnosis not present

## 2021-07-11 DIAGNOSIS — R2681 Unsteadiness on feet: Secondary | ICD-10-CM | POA: Diagnosis not present

## 2021-07-11 DIAGNOSIS — F0390 Unspecified dementia without behavioral disturbance: Secondary | ICD-10-CM | POA: Diagnosis not present

## 2021-07-11 DIAGNOSIS — R41841 Cognitive communication deficit: Secondary | ICD-10-CM | POA: Diagnosis not present

## 2021-07-11 DIAGNOSIS — M6281 Muscle weakness (generalized): Secondary | ICD-10-CM | POA: Diagnosis not present

## 2021-07-12 DIAGNOSIS — R2681 Unsteadiness on feet: Secondary | ICD-10-CM | POA: Diagnosis not present

## 2021-07-12 DIAGNOSIS — R41841 Cognitive communication deficit: Secondary | ICD-10-CM | POA: Diagnosis not present

## 2021-07-12 DIAGNOSIS — M6281 Muscle weakness (generalized): Secondary | ICD-10-CM | POA: Diagnosis not present

## 2021-07-12 DIAGNOSIS — F0390 Unspecified dementia without behavioral disturbance: Secondary | ICD-10-CM | POA: Diagnosis not present

## 2021-07-17 DIAGNOSIS — M6281 Muscle weakness (generalized): Secondary | ICD-10-CM | POA: Diagnosis not present

## 2021-07-17 DIAGNOSIS — R2681 Unsteadiness on feet: Secondary | ICD-10-CM | POA: Diagnosis not present

## 2021-07-17 DIAGNOSIS — R41841 Cognitive communication deficit: Secondary | ICD-10-CM | POA: Diagnosis not present

## 2021-07-18 DIAGNOSIS — F0390 Unspecified dementia without behavioral disturbance: Secondary | ICD-10-CM | POA: Diagnosis not present

## 2021-07-18 DIAGNOSIS — R41841 Cognitive communication deficit: Secondary | ICD-10-CM | POA: Diagnosis not present

## 2021-07-19 DIAGNOSIS — M6281 Muscle weakness (generalized): Secondary | ICD-10-CM | POA: Diagnosis not present

## 2021-07-20 DIAGNOSIS — R41841 Cognitive communication deficit: Secondary | ICD-10-CM | POA: Diagnosis not present

## 2021-07-20 DIAGNOSIS — F0390 Unspecified dementia without behavioral disturbance: Secondary | ICD-10-CM | POA: Diagnosis not present

## 2021-07-21 DIAGNOSIS — F0390 Unspecified dementia without behavioral disturbance: Secondary | ICD-10-CM | POA: Diagnosis not present

## 2021-07-21 DIAGNOSIS — R41841 Cognitive communication deficit: Secondary | ICD-10-CM | POA: Diagnosis not present

## 2021-07-25 DIAGNOSIS — F0390 Unspecified dementia without behavioral disturbance: Secondary | ICD-10-CM | POA: Diagnosis not present

## 2021-07-25 DIAGNOSIS — R2681 Unsteadiness on feet: Secondary | ICD-10-CM | POA: Diagnosis not present

## 2021-07-25 DIAGNOSIS — R41841 Cognitive communication deficit: Secondary | ICD-10-CM | POA: Diagnosis not present

## 2021-07-25 DIAGNOSIS — M6281 Muscle weakness (generalized): Secondary | ICD-10-CM | POA: Diagnosis not present

## 2021-07-27 DIAGNOSIS — F0390 Unspecified dementia without behavioral disturbance: Secondary | ICD-10-CM | POA: Diagnosis not present

## 2021-07-27 DIAGNOSIS — M6281 Muscle weakness (generalized): Secondary | ICD-10-CM | POA: Diagnosis not present

## 2021-07-27 DIAGNOSIS — R41841 Cognitive communication deficit: Secondary | ICD-10-CM | POA: Diagnosis not present

## 2021-07-27 DIAGNOSIS — R2681 Unsteadiness on feet: Secondary | ICD-10-CM | POA: Diagnosis not present

## 2021-07-28 DIAGNOSIS — R41841 Cognitive communication deficit: Secondary | ICD-10-CM | POA: Diagnosis not present

## 2021-07-28 DIAGNOSIS — F0390 Unspecified dementia without behavioral disturbance: Secondary | ICD-10-CM | POA: Diagnosis not present

## 2021-08-01 DIAGNOSIS — M6281 Muscle weakness (generalized): Secondary | ICD-10-CM | POA: Diagnosis not present

## 2021-08-01 DIAGNOSIS — R2681 Unsteadiness on feet: Secondary | ICD-10-CM | POA: Diagnosis not present

## 2021-08-01 DIAGNOSIS — R41841 Cognitive communication deficit: Secondary | ICD-10-CM | POA: Diagnosis not present

## 2021-08-02 DIAGNOSIS — R2681 Unsteadiness on feet: Secondary | ICD-10-CM | POA: Diagnosis not present

## 2021-08-02 DIAGNOSIS — M6281 Muscle weakness (generalized): Secondary | ICD-10-CM | POA: Diagnosis not present

## 2021-08-04 DIAGNOSIS — M6281 Muscle weakness (generalized): Secondary | ICD-10-CM | POA: Diagnosis not present

## 2021-08-04 DIAGNOSIS — R2681 Unsteadiness on feet: Secondary | ICD-10-CM | POA: Diagnosis not present

## 2021-08-07 DIAGNOSIS — M6281 Muscle weakness (generalized): Secondary | ICD-10-CM | POA: Diagnosis not present

## 2021-08-07 DIAGNOSIS — R2681 Unsteadiness on feet: Secondary | ICD-10-CM | POA: Diagnosis not present

## 2021-08-08 DIAGNOSIS — R41841 Cognitive communication deficit: Secondary | ICD-10-CM | POA: Diagnosis not present

## 2021-08-08 DIAGNOSIS — R2681 Unsteadiness on feet: Secondary | ICD-10-CM | POA: Diagnosis not present

## 2021-08-08 DIAGNOSIS — M6281 Muscle weakness (generalized): Secondary | ICD-10-CM | POA: Diagnosis not present

## 2021-08-10 DIAGNOSIS — R2681 Unsteadiness on feet: Secondary | ICD-10-CM | POA: Diagnosis not present

## 2021-08-10 DIAGNOSIS — M6281 Muscle weakness (generalized): Secondary | ICD-10-CM | POA: Diagnosis not present

## 2021-08-10 DIAGNOSIS — R41841 Cognitive communication deficit: Secondary | ICD-10-CM | POA: Diagnosis not present

## 2021-08-14 DIAGNOSIS — R41841 Cognitive communication deficit: Secondary | ICD-10-CM | POA: Diagnosis not present

## 2021-08-14 DIAGNOSIS — M6281 Muscle weakness (generalized): Secondary | ICD-10-CM | POA: Diagnosis not present

## 2021-08-14 DIAGNOSIS — R2681 Unsteadiness on feet: Secondary | ICD-10-CM | POA: Diagnosis not present

## 2021-08-17 DIAGNOSIS — R2681 Unsteadiness on feet: Secondary | ICD-10-CM | POA: Diagnosis not present

## 2021-08-17 DIAGNOSIS — R41841 Cognitive communication deficit: Secondary | ICD-10-CM | POA: Diagnosis not present

## 2021-08-17 DIAGNOSIS — M6281 Muscle weakness (generalized): Secondary | ICD-10-CM | POA: Diagnosis not present

## 2021-08-21 DIAGNOSIS — R2681 Unsteadiness on feet: Secondary | ICD-10-CM | POA: Diagnosis not present

## 2021-08-21 DIAGNOSIS — M6281 Muscle weakness (generalized): Secondary | ICD-10-CM | POA: Diagnosis not present

## 2021-08-22 DIAGNOSIS — R41841 Cognitive communication deficit: Secondary | ICD-10-CM | POA: Diagnosis not present

## 2021-08-22 DIAGNOSIS — M6281 Muscle weakness (generalized): Secondary | ICD-10-CM | POA: Diagnosis not present

## 2021-08-22 DIAGNOSIS — R2681 Unsteadiness on feet: Secondary | ICD-10-CM | POA: Diagnosis not present

## 2021-08-24 DIAGNOSIS — R2681 Unsteadiness on feet: Secondary | ICD-10-CM | POA: Diagnosis not present

## 2021-08-24 DIAGNOSIS — M6281 Muscle weakness (generalized): Secondary | ICD-10-CM | POA: Diagnosis not present

## 2021-08-25 DIAGNOSIS — R2681 Unsteadiness on feet: Secondary | ICD-10-CM | POA: Diagnosis not present

## 2021-08-25 DIAGNOSIS — M6281 Muscle weakness (generalized): Secondary | ICD-10-CM | POA: Diagnosis not present

## 2021-08-29 DIAGNOSIS — M6281 Muscle weakness (generalized): Secondary | ICD-10-CM | POA: Diagnosis not present

## 2021-08-29 DIAGNOSIS — R41841 Cognitive communication deficit: Secondary | ICD-10-CM | POA: Diagnosis not present

## 2021-08-29 DIAGNOSIS — R2681 Unsteadiness on feet: Secondary | ICD-10-CM | POA: Diagnosis not present

## 2021-08-31 DIAGNOSIS — R2681 Unsteadiness on feet: Secondary | ICD-10-CM | POA: Diagnosis not present

## 2021-08-31 DIAGNOSIS — M6281 Muscle weakness (generalized): Secondary | ICD-10-CM | POA: Diagnosis not present

## 2021-09-01 DIAGNOSIS — M6281 Muscle weakness (generalized): Secondary | ICD-10-CM | POA: Diagnosis not present

## 2021-09-01 DIAGNOSIS — R41841 Cognitive communication deficit: Secondary | ICD-10-CM | POA: Diagnosis not present

## 2021-09-01 DIAGNOSIS — R2681 Unsteadiness on feet: Secondary | ICD-10-CM | POA: Diagnosis not present

## 2021-09-04 DIAGNOSIS — R2681 Unsteadiness on feet: Secondary | ICD-10-CM | POA: Diagnosis not present

## 2021-09-04 DIAGNOSIS — M6281 Muscle weakness (generalized): Secondary | ICD-10-CM | POA: Diagnosis not present

## 2021-09-05 DIAGNOSIS — R2681 Unsteadiness on feet: Secondary | ICD-10-CM | POA: Diagnosis not present

## 2021-09-05 DIAGNOSIS — M6281 Muscle weakness (generalized): Secondary | ICD-10-CM | POA: Diagnosis not present

## 2021-09-07 DIAGNOSIS — M6281 Muscle weakness (generalized): Secondary | ICD-10-CM | POA: Diagnosis not present

## 2021-09-07 DIAGNOSIS — R2681 Unsteadiness on feet: Secondary | ICD-10-CM | POA: Diagnosis not present

## 2021-09-07 DIAGNOSIS — R41841 Cognitive communication deficit: Secondary | ICD-10-CM | POA: Diagnosis not present

## 2021-09-12 DIAGNOSIS — R2681 Unsteadiness on feet: Secondary | ICD-10-CM | POA: Diagnosis not present

## 2021-09-12 DIAGNOSIS — M6281 Muscle weakness (generalized): Secondary | ICD-10-CM | POA: Diagnosis not present

## 2021-09-14 DIAGNOSIS — M6281 Muscle weakness (generalized): Secondary | ICD-10-CM | POA: Diagnosis not present

## 2021-09-14 DIAGNOSIS — R41841 Cognitive communication deficit: Secondary | ICD-10-CM | POA: Diagnosis not present

## 2021-09-14 DIAGNOSIS — R2681 Unsteadiness on feet: Secondary | ICD-10-CM | POA: Diagnosis not present

## 2021-09-18 DIAGNOSIS — R41841 Cognitive communication deficit: Secondary | ICD-10-CM | POA: Diagnosis not present

## 2021-09-18 DIAGNOSIS — M6281 Muscle weakness (generalized): Secondary | ICD-10-CM | POA: Diagnosis not present

## 2021-09-18 DIAGNOSIS — R2681 Unsteadiness on feet: Secondary | ICD-10-CM | POA: Diagnosis not present

## 2021-09-19 DIAGNOSIS — R2681 Unsteadiness on feet: Secondary | ICD-10-CM | POA: Diagnosis not present

## 2021-09-19 DIAGNOSIS — M6281 Muscle weakness (generalized): Secondary | ICD-10-CM | POA: Diagnosis not present

## 2021-09-20 DIAGNOSIS — R2681 Unsteadiness on feet: Secondary | ICD-10-CM | POA: Diagnosis not present

## 2021-09-20 DIAGNOSIS — M6281 Muscle weakness (generalized): Secondary | ICD-10-CM | POA: Diagnosis not present

## 2021-09-21 DIAGNOSIS — R2681 Unsteadiness on feet: Secondary | ICD-10-CM | POA: Diagnosis not present

## 2021-09-21 DIAGNOSIS — M6281 Muscle weakness (generalized): Secondary | ICD-10-CM | POA: Diagnosis not present

## 2021-09-25 DIAGNOSIS — R41841 Cognitive communication deficit: Secondary | ICD-10-CM | POA: Diagnosis not present

## 2021-09-26 ENCOUNTER — Encounter: Payer: Self-pay | Admitting: Podiatry

## 2021-09-26 ENCOUNTER — Ambulatory Visit (INDEPENDENT_AMBULATORY_CARE_PROVIDER_SITE_OTHER): Payer: Medicare Other | Admitting: Podiatry

## 2021-09-26 ENCOUNTER — Other Ambulatory Visit: Payer: Self-pay

## 2021-09-26 DIAGNOSIS — B351 Tinea unguium: Secondary | ICD-10-CM

## 2021-09-26 DIAGNOSIS — M79674 Pain in right toe(s): Secondary | ICD-10-CM

## 2021-09-26 DIAGNOSIS — M6281 Muscle weakness (generalized): Secondary | ICD-10-CM | POA: Diagnosis not present

## 2021-09-26 DIAGNOSIS — M79675 Pain in left toe(s): Secondary | ICD-10-CM | POA: Diagnosis not present

## 2021-09-26 DIAGNOSIS — R2681 Unsteadiness on feet: Secondary | ICD-10-CM | POA: Diagnosis not present

## 2021-09-26 NOTE — Progress Notes (Signed)
This patient presents to the office with chief complaint of long thick painful nails.  Patient says the nails are painful walking and wearing shoes.  This patient is unable to self treat.  This patient is unable to trim her nails since she is unable to reach her nails. She presents to the office with her daughter. She presents to the office for preventative foot care services.   General Appearance  Alert, conversant and in no acute stress.  Vascular  Dorsalis pedis and posterior tibial  pulses are weakly  palpable  bilaterally.  Capillary return is within normal limits  bilaterally. Temperature is within normal limits  bilaterally.  Neurologic  Senn-Weinstein monofilament wire test within normal limits  bilaterally. Muscle power within normal limits bilaterally.  Nails Thick disfigured discolored nails with subungual debris  from hallux to fifth toes bilaterally. No evidence of bacterial infection or drainage bilaterally.  Orthopedic  No limitations of motion  feet .  No crepitus or effusions noted.  No bony pathology or digital deformities noted.  Skin  normotropic skin with no porokeratosis noted bilaterally.  No signs of infections or ulcers noted.     Onychomycosis  Nails  B/L.  Pain in right toes  Pain in left toes  Debridement of nails both feet followed trimming the nails with dremel tool.    RTC 3 months.   Melody Wilson DPM   

## 2021-09-28 DIAGNOSIS — M6281 Muscle weakness (generalized): Secondary | ICD-10-CM | POA: Diagnosis not present

## 2021-09-28 DIAGNOSIS — R2681 Unsteadiness on feet: Secondary | ICD-10-CM | POA: Diagnosis not present

## 2021-09-29 DIAGNOSIS — R41841 Cognitive communication deficit: Secondary | ICD-10-CM | POA: Diagnosis not present

## 2021-10-02 DIAGNOSIS — M6281 Muscle weakness (generalized): Secondary | ICD-10-CM | POA: Diagnosis not present

## 2021-10-02 DIAGNOSIS — R2681 Unsteadiness on feet: Secondary | ICD-10-CM | POA: Diagnosis not present

## 2021-10-02 DIAGNOSIS — R41841 Cognitive communication deficit: Secondary | ICD-10-CM | POA: Diagnosis not present

## 2021-10-03 DIAGNOSIS — M6281 Muscle weakness (generalized): Secondary | ICD-10-CM | POA: Diagnosis not present

## 2021-10-03 DIAGNOSIS — R2681 Unsteadiness on feet: Secondary | ICD-10-CM | POA: Diagnosis not present

## 2021-10-06 DIAGNOSIS — R41841 Cognitive communication deficit: Secondary | ICD-10-CM | POA: Diagnosis not present

## 2021-10-06 DIAGNOSIS — M6281 Muscle weakness (generalized): Secondary | ICD-10-CM | POA: Diagnosis not present

## 2021-10-06 DIAGNOSIS — R2681 Unsteadiness on feet: Secondary | ICD-10-CM | POA: Diagnosis not present

## 2021-10-10 DIAGNOSIS — M6281 Muscle weakness (generalized): Secondary | ICD-10-CM | POA: Diagnosis not present

## 2021-10-10 DIAGNOSIS — R2681 Unsteadiness on feet: Secondary | ICD-10-CM | POA: Diagnosis not present

## 2021-10-11 DIAGNOSIS — R2681 Unsteadiness on feet: Secondary | ICD-10-CM | POA: Diagnosis not present

## 2021-10-11 DIAGNOSIS — M6281 Muscle weakness (generalized): Secondary | ICD-10-CM | POA: Diagnosis not present

## 2021-10-11 DIAGNOSIS — R41841 Cognitive communication deficit: Secondary | ICD-10-CM | POA: Diagnosis not present

## 2021-10-16 DIAGNOSIS — M6281 Muscle weakness (generalized): Secondary | ICD-10-CM | POA: Diagnosis not present

## 2021-10-16 DIAGNOSIS — R2681 Unsteadiness on feet: Secondary | ICD-10-CM | POA: Diagnosis not present

## 2021-10-18 DIAGNOSIS — R2681 Unsteadiness on feet: Secondary | ICD-10-CM | POA: Diagnosis not present

## 2021-10-18 DIAGNOSIS — R41841 Cognitive communication deficit: Secondary | ICD-10-CM | POA: Diagnosis not present

## 2021-10-18 DIAGNOSIS — M6281 Muscle weakness (generalized): Secondary | ICD-10-CM | POA: Diagnosis not present

## 2021-10-20 DIAGNOSIS — R41841 Cognitive communication deficit: Secondary | ICD-10-CM | POA: Diagnosis not present

## 2021-10-20 DIAGNOSIS — M6281 Muscle weakness (generalized): Secondary | ICD-10-CM | POA: Diagnosis not present

## 2021-10-20 DIAGNOSIS — R2681 Unsteadiness on feet: Secondary | ICD-10-CM | POA: Diagnosis not present

## 2021-10-23 DIAGNOSIS — R41841 Cognitive communication deficit: Secondary | ICD-10-CM | POA: Diagnosis not present

## 2021-10-24 DIAGNOSIS — M6281 Muscle weakness (generalized): Secondary | ICD-10-CM | POA: Diagnosis not present

## 2021-10-24 DIAGNOSIS — R2681 Unsteadiness on feet: Secondary | ICD-10-CM | POA: Diagnosis not present

## 2021-10-25 DIAGNOSIS — M6281 Muscle weakness (generalized): Secondary | ICD-10-CM | POA: Diagnosis not present

## 2021-10-25 DIAGNOSIS — R2681 Unsteadiness on feet: Secondary | ICD-10-CM | POA: Diagnosis not present

## 2021-10-27 DIAGNOSIS — R41841 Cognitive communication deficit: Secondary | ICD-10-CM | POA: Diagnosis not present

## 2021-10-30 DIAGNOSIS — R2681 Unsteadiness on feet: Secondary | ICD-10-CM | POA: Diagnosis not present

## 2021-10-30 DIAGNOSIS — R41841 Cognitive communication deficit: Secondary | ICD-10-CM | POA: Diagnosis not present

## 2021-10-30 DIAGNOSIS — M6281 Muscle weakness (generalized): Secondary | ICD-10-CM | POA: Diagnosis not present

## 2021-11-01 DIAGNOSIS — M6281 Muscle weakness (generalized): Secondary | ICD-10-CM | POA: Diagnosis not present

## 2021-11-01 DIAGNOSIS — R2681 Unsteadiness on feet: Secondary | ICD-10-CM | POA: Diagnosis not present

## 2021-11-03 DIAGNOSIS — R41841 Cognitive communication deficit: Secondary | ICD-10-CM | POA: Diagnosis not present

## 2021-11-03 DIAGNOSIS — M6281 Muscle weakness (generalized): Secondary | ICD-10-CM | POA: Diagnosis not present

## 2021-11-03 DIAGNOSIS — R2681 Unsteadiness on feet: Secondary | ICD-10-CM | POA: Diagnosis not present

## 2021-11-06 DIAGNOSIS — R2681 Unsteadiness on feet: Secondary | ICD-10-CM | POA: Diagnosis not present

## 2021-11-06 DIAGNOSIS — M6281 Muscle weakness (generalized): Secondary | ICD-10-CM | POA: Diagnosis not present

## 2021-11-06 DIAGNOSIS — R41841 Cognitive communication deficit: Secondary | ICD-10-CM | POA: Diagnosis not present

## 2021-11-09 DIAGNOSIS — R2681 Unsteadiness on feet: Secondary | ICD-10-CM | POA: Diagnosis not present

## 2021-11-09 DIAGNOSIS — M6281 Muscle weakness (generalized): Secondary | ICD-10-CM | POA: Diagnosis not present

## 2021-11-13 DIAGNOSIS — R2681 Unsteadiness on feet: Secondary | ICD-10-CM | POA: Diagnosis not present

## 2021-11-13 DIAGNOSIS — M6281 Muscle weakness (generalized): Secondary | ICD-10-CM | POA: Diagnosis not present

## 2021-11-15 DIAGNOSIS — R2681 Unsteadiness on feet: Secondary | ICD-10-CM | POA: Diagnosis not present

## 2021-11-15 DIAGNOSIS — M6281 Muscle weakness (generalized): Secondary | ICD-10-CM | POA: Diagnosis not present

## 2021-11-16 DIAGNOSIS — R2681 Unsteadiness on feet: Secondary | ICD-10-CM | POA: Diagnosis not present

## 2021-11-16 DIAGNOSIS — M6281 Muscle weakness (generalized): Secondary | ICD-10-CM | POA: Diagnosis not present

## 2021-11-20 DIAGNOSIS — M6281 Muscle weakness (generalized): Secondary | ICD-10-CM | POA: Diagnosis not present

## 2021-11-20 DIAGNOSIS — R41841 Cognitive communication deficit: Secondary | ICD-10-CM | POA: Diagnosis not present

## 2021-11-20 DIAGNOSIS — R2681 Unsteadiness on feet: Secondary | ICD-10-CM | POA: Diagnosis not present

## 2021-11-22 DIAGNOSIS — R2681 Unsteadiness on feet: Secondary | ICD-10-CM | POA: Diagnosis not present

## 2021-11-22 DIAGNOSIS — M6281 Muscle weakness (generalized): Secondary | ICD-10-CM | POA: Diagnosis not present

## 2021-11-24 DIAGNOSIS — R41841 Cognitive communication deficit: Secondary | ICD-10-CM | POA: Diagnosis not present

## 2021-11-24 DIAGNOSIS — M6281 Muscle weakness (generalized): Secondary | ICD-10-CM | POA: Diagnosis not present

## 2021-11-24 DIAGNOSIS — R2681 Unsteadiness on feet: Secondary | ICD-10-CM | POA: Diagnosis not present

## 2021-11-27 DIAGNOSIS — R2681 Unsteadiness on feet: Secondary | ICD-10-CM | POA: Diagnosis not present

## 2021-11-27 DIAGNOSIS — M6281 Muscle weakness (generalized): Secondary | ICD-10-CM | POA: Diagnosis not present

## 2021-11-27 DIAGNOSIS — R41841 Cognitive communication deficit: Secondary | ICD-10-CM | POA: Diagnosis not present

## 2021-11-29 DIAGNOSIS — M6281 Muscle weakness (generalized): Secondary | ICD-10-CM | POA: Diagnosis not present

## 2021-11-29 DIAGNOSIS — R2681 Unsteadiness on feet: Secondary | ICD-10-CM | POA: Diagnosis not present

## 2021-12-01 DIAGNOSIS — R41841 Cognitive communication deficit: Secondary | ICD-10-CM | POA: Diagnosis not present

## 2021-12-04 DIAGNOSIS — R41841 Cognitive communication deficit: Secondary | ICD-10-CM | POA: Diagnosis not present

## 2021-12-05 ENCOUNTER — Encounter: Payer: Self-pay | Admitting: Podiatry

## 2021-12-05 ENCOUNTER — Ambulatory Visit (INDEPENDENT_AMBULATORY_CARE_PROVIDER_SITE_OTHER): Payer: Medicare Other | Admitting: Podiatry

## 2021-12-05 DIAGNOSIS — M79674 Pain in right toe(s): Secondary | ICD-10-CM

## 2021-12-05 DIAGNOSIS — B351 Tinea unguium: Secondary | ICD-10-CM

## 2021-12-05 DIAGNOSIS — M79675 Pain in left toe(s): Secondary | ICD-10-CM | POA: Diagnosis not present

## 2021-12-05 DIAGNOSIS — R2681 Unsteadiness on feet: Secondary | ICD-10-CM | POA: Diagnosis not present

## 2021-12-05 DIAGNOSIS — M6281 Muscle weakness (generalized): Secondary | ICD-10-CM | POA: Diagnosis not present

## 2021-12-05 NOTE — Progress Notes (Signed)
This patient presents to the office with chief complaint of long thick painful nails.  Patient says the nails are painful walking and wearing shoes.  This patient is unable to self treat.  This patient is unable to trim her nails since she is unable to reach her nails. She presents to the office with her daughter. She presents to the office for preventative foot care services.   General Appearance  Alert, conversant and in no acute stress.  Vascular  Dorsalis pedis and posterior tibial  pulses are weakly  palpable  bilaterally.  Capillary return is within normal limits  bilaterally. Temperature is within normal limits  bilaterally.  Neurologic  Senn-Weinstein monofilament wire test within normal limits  bilaterally. Muscle power within normal limits bilaterally.  Nails Thick disfigured discolored nails with subungual debris  from hallux to fifth toes bilaterally. No evidence of bacterial infection or drainage bilaterally.  Orthopedic  No limitations of motion  feet .  No crepitus or effusions noted.  No bony pathology or digital deformities noted.  Skin  normotropic skin with no porokeratosis noted bilaterally.  No signs of infections or ulcers noted.     Onychomycosis  Nails  B/L.  Pain in right toes  Pain in left toes  Debridement of nails both feet followed trimming the nails with dremel tool.    RTC 3 months.   Melody Wilson DPM   

## 2021-12-07 DIAGNOSIS — R2681 Unsteadiness on feet: Secondary | ICD-10-CM | POA: Diagnosis not present

## 2021-12-07 DIAGNOSIS — M6281 Muscle weakness (generalized): Secondary | ICD-10-CM | POA: Diagnosis not present

## 2021-12-08 DIAGNOSIS — R41841 Cognitive communication deficit: Secondary | ICD-10-CM | POA: Diagnosis not present

## 2021-12-13 DIAGNOSIS — R2681 Unsteadiness on feet: Secondary | ICD-10-CM | POA: Diagnosis not present

## 2021-12-13 DIAGNOSIS — M6281 Muscle weakness (generalized): Secondary | ICD-10-CM | POA: Diagnosis not present

## 2021-12-14 DIAGNOSIS — R41841 Cognitive communication deficit: Secondary | ICD-10-CM | POA: Diagnosis not present

## 2021-12-15 DIAGNOSIS — R2681 Unsteadiness on feet: Secondary | ICD-10-CM | POA: Diagnosis not present

## 2021-12-15 DIAGNOSIS — R41841 Cognitive communication deficit: Secondary | ICD-10-CM | POA: Diagnosis not present

## 2021-12-15 DIAGNOSIS — M6281 Muscle weakness (generalized): Secondary | ICD-10-CM | POA: Diagnosis not present

## 2021-12-18 DIAGNOSIS — M6281 Muscle weakness (generalized): Secondary | ICD-10-CM | POA: Diagnosis not present

## 2021-12-18 DIAGNOSIS — R41841 Cognitive communication deficit: Secondary | ICD-10-CM | POA: Diagnosis not present

## 2021-12-18 DIAGNOSIS — R2681 Unsteadiness on feet: Secondary | ICD-10-CM | POA: Diagnosis not present

## 2021-12-19 ENCOUNTER — Encounter: Payer: Self-pay | Admitting: Physician Assistant

## 2021-12-19 ENCOUNTER — Ambulatory Visit (INDEPENDENT_AMBULATORY_CARE_PROVIDER_SITE_OTHER): Payer: Medicare Other | Admitting: Physician Assistant

## 2021-12-19 VITALS — BP 130/78 | HR 84 | Resp 18 | Ht <= 58 in | Wt 145.0 lb

## 2021-12-19 DIAGNOSIS — F0280 Dementia in other diseases classified elsewhere without behavioral disturbance: Secondary | ICD-10-CM | POA: Diagnosis not present

## 2021-12-19 DIAGNOSIS — R41841 Cognitive communication deficit: Secondary | ICD-10-CM | POA: Diagnosis not present

## 2021-12-19 DIAGNOSIS — G301 Alzheimer's disease with late onset: Secondary | ICD-10-CM

## 2021-12-19 NOTE — Patient Instructions (Signed)
It was a pleasure to see you today at our office.  ? ?Recommendations: ? ?Meds: ?Follow up in  6 months ?Continue donepezil 10 mg daily. Side effects were discussed  ?Start Memantine '10mg'$  tablets.  Take 1/2  tablet at bedtime for 2 weeks, then 1 tablet nightly.  Side effects include dizziness, headache, diarrhea or constipation.  Call with any questions or concerns.  ?Feel free to visit Facebook page " [email or web address]" for tips of how to care for people with memory problems.  ? ? ?RECOMMENDATIONS FOR ALL PATIENTS WITH MEMORY PROBLEMS: ?1. Continue to exercise (Recommend 30 minutes of walking everyday, or 3 hours every week) ?2. Increase social interactions - continue going to [place] and enjoy social gatherings with friends and family ?3. Eat healthy, avoid fried foods and eat more fruits and vegetables ?4. Maintain adequate blood pressure, blood sugar, and blood cholesterol level. Reducing the risk of stroke and cardiovascular disease also helps promoting better memory. ?5. Avoid stressful situations. Live a simple life and avoid aggravations. Organize your time and prepare for the next day in anticipation. ?6. Sleep well, avoid any interruptions of sleep and avoid any distractions in the bedroom that may interfere with adequate sleep quality ?7. Avoid sugar, avoid sweets as there is a strong link between excessive sugar intake, diabetes, and cognitive impairment ?We discussed the Mediterranean diet, which has been shown to help patients reduce the risk of progressive memory disorders and reduces cardiovascular risk. This includes eating fish, eat fruits and green leafy vegetables, nuts like almonds and hazelnuts, walnuts, and also use olive oil. Avoid fast foods and fried foods as much as possible. Avoid sweets and sugar as sugar use has been linked to worsening of memory function. ? ?There is always a concern of gradual progression of memory problems. If this is the case, then we may need to adjust level of care according  to patient needs. Support, both to the patient and caregiver, should then be put into place.  ? ? ?The Alzheimer?s Association is here all day, every day for people facing Alzheimer?s disease through our free 24/7 Helpline: [phone]. The Helpline provides reliable information and support to all those who need assistance, such as individuals living with memory loss, Alzheimer's or other dementia, caregivers, health care professionals and the public.  ?Our highly trained and knowledgeable staff can help you with: ?Understanding memory loss, dementia and Alzheimer's  ?Medications and other treatment options  ?General information about aging and brain health  ?Skills to provide quality care and to find the best care from professionals  ?Legal, financial and living-arrangement decisions ?Our Helpline also features: ?Confidential care consultation provided by master's level clinicians who can help with decision-making support, crisis assistance and education on issues families face every day  ?Help in a caller's preferred language using our translation service that features more than 200 languages and dialects  ?Referrals to local community programs, services and ongoing support ? ? ? ? ?FALL PRECAUTIONS: Be cautious when walking. Scan the area for obstacles that may increase the risk of trips and falls. When getting up in the mornings, sit up at the edge of the bed for a few minutes before getting out of bed. Consider elevating the bed at the head end to avoid drop of blood pressure when getting up. Walk always in a well-lit room (use night lights in the walls). Avoid area rugs or power cords from appliances in the middle of the walkways. Use a walker or a cane if necessary  and consider physical therapy for balance exercise. Get your eyesight checked regularly. ? ?FINANCIAL OVERSIGHT: Supervision, especially oversight when making financial decisions or transactions is also recommended. ? ?HOME SAFETY: Consider the  safety of the kitchen when operating appliances like stoves, microwave oven, and blender. Consider having supervision and share cooking responsibilities until no longer able to participate in those. Accidents with firearms and other hazards in the house should be identified and addressed as well. ? ? ?ABILITY TO BE LEFT ALONE: If patient is unable to contact 911 operator, consider using LifeLine, or when the need is there, arrange for someone to stay with patients. Smoking is a fire hazard, consider supervision or cessation. Risk of wandering should be assessed by caregiver and if detected at any point, supervision and safe proof recommendations should be instituted. ? ?MEDICATION SUPERVISION: Inability to self-administer medication needs to be constantly addressed. Implement a mechanism to ensure safe administration of the medications. ? ?  ? ?  ? ? ?Mediterranean Diet ?A Mediterranean diet refers to food and lifestyle choices that are based on the traditions of countries located on the [employer]. This way of eating has been shown to help prevent certain conditions and improve outcomes for people who have chronic diseases, like kidney disease and heart disease. ?What are tips for following this plan? ?Lifestyle  ?Cook and eat meals together with your family, when possible. ?Drink enough fluid to keep your urine clear or pale yellow. ?Be physically active every day. This includes: ?Aerobic exercise like running or swimming. ?Leisure activities like gardening, walking, or housework. ?Get 7-8 hours of sleep each night. ?If recommended by your health care provider, drink red wine in moderation. This means 1 glass a day for nonpregnant women and 2 glasses a day for men. A glass of wine equals 5 oz (150 mL). ?Reading food labels  ?Check the serving size of packaged foods. For foods such as rice and pasta, the serving size refers to the amount of cooked product, not dry. ?Check the total fat in packaged foods.  Avoid foods that have saturated fat or trans fats. ?Check the ingredients list for added sugars, such as corn syrup. ?Shopping  ?At the grocery store, buy most of your food from the areas near the walls of the store. This includes: ?Fresh fruits and vegetables (produce). ?Grains, beans, nuts, and seeds. Some of these may be available in unpackaged forms or large amounts (in bulk). ?Fresh seafood. ?Poultry and eggs. ?Low-fat dairy products. ?Buy whole ingredients instead of prepackaged foods. ?Buy fresh fruits and vegetables in-season from local farmers markets. ?Buy frozen fruits and vegetables in resealable bags. ?If you do not have access to quality fresh seafood, buy precooked frozen shrimp or canned fish, such as tuna, salmon, or sardines. ?Buy small amounts of raw or cooked vegetables, salads, or olives from the deli or salad bar at your store. ?Stock your pantry so you always have certain foods on hand, such as olive oil, canned tuna, canned tomatoes, rice, pasta, and beans. ?Cooking  ?Cook foods with extra-virgin olive oil instead of using butter or other vegetable oils. ?Have meat as a side dish, and have vegetables or grains as your main dish. This means having meat in small portions or adding small amounts of meat to foods like pasta or stew. ?Use beans or vegetables instead of meat in common dishes like chili or lasagna. ?Experiment with different cooking methods. Try roasting or broiling vegetables instead of steaming or saut?eing them. ?Add frozen vegetables  to soups, stews, pasta, or rice. ?Add nuts or seeds for added healthy fat at each meal. You can add these to yogurt, salads, or vegetable dishes. ?Marinate fish or vegetables using olive oil, lemon juice, garlic, and fresh herbs. ?Meal planning  ?Plan to eat 1 vegetarian meal one day each week. Try to work up to 2 vegetarian meals, if possible. ?Eat seafood 2 or more times a week. ?Have healthy snacks readily available, such as: ?Vegetable sticks  with hummus. ?[place] yogurt. ?Fruit and nut trail mix. ?Eat balanced meals throughout the week. This includes: ?Fruit: 2-3 servings a day ?Vegetables: 4-5 servings a day ?Low-fat dairy: 2 servings a day ?Fish,

## 2021-12-19 NOTE — Progress Notes (Signed)
? ?Assessment/Plan:  ? ? ?Late onset Alzheimer's dementia, mild without behavioral disturbance ? ? ?EMI Melody Wilson is a 88 y.o. right-handed female  seen today in follow up for memory loss.  She was last seen on 06/21/21 at which time her MMSE was 20/30 . She is on Donepezil 10 mg daily, and memantine 10 mg daily, tolerating well. She is stable from the cognitive standpoint. ?  ? ? Recommendations:  ? ?Discussed safety both in and out of the home.  ?Discussed the importance of regular daily schedule with inclusion of crossword puzzles to maintain brain function.  ?Continue to monitor mood by PCP ?Stay active at least 30 minutes at least 3 times a week.  ?Naps should be scheduled and should be no longer than 60 minutes and should not occur after 2 PM.  ?Mediterranean diet is recommended  ?Continue donepezil 10 mg daily Side effects were discussed ? Continue memantine 10 mg nightly  ?Follow up in 6 months. ? ? ?Case discussed with Dr. Delice Lesch who agrees with the plan ? ? ? ? ?Subjective:  ? ? This patient is accompanied in the office by her son who supplements the history.  Previous records as well as any outside records available were reviewed prior to todays visit.  Patient is currently on donepezil 10 mg daily and memantine 10 mg daily. She lives in [place] . She enjoys the facility, been walking, enjoying interaction with other residents, she likes to "watch them doing the puzzles, but not doing them ".  She no longer drives ?Daughter reports that her memory is about the same, but admits not wanting to take the antidementia meds.  Insight is decreased.  She no longer cooks.  Her appetite is slightly improved, has gained a few pounds.  She continues to not drink enough water.  She has to be reminded to shower, and she dresses herself.  She does misplace objects, but quickly finds them.  She denies any visual or auditory hallucinations, but she does not like to think about remote situations, as  they are traumatic.  She denies nightmares.She has not complained of headaches recently.Denies double vision, dizziness, focal numbness or tingling, unilateral weakness or tremors or anosmia. No history of seizures. Denies urine incontinence, retention, constipation or diarrhea. SHe used to have sitters but it will resume soon, had to be placed on hiatus due to insurance. ? ?"HISTORY OF PRESENT ILLNESS (from 04/15/2020): ? This is a pleasant 88 year old right-handed woman with a history of breast cancer presenting for evaluation of dementia. Her 2 sons Melody Wilson and Melody Wilson are present and provided additional information privately as well. The patient feels her memory is "ok for my age." Her sons started noticing short-term memory changes for the past few years, but more concerning recently. She forgets dates and appointments. Bills were not getting paid, her sons report partly due to being unable to get to places to physically pay them being a challenge. She had not paid the homeowners fee last May. She gets confused when he asks her questions. She gets frustrated and would shut down, not wanting to talk. She continues to drive locally and denies getting lost, sons have not gotten any calls about this. They state she hardly leaves the house. She is not taking any regular medications. She used to have Tramadol for her back pain but she had started taking it daily so her sons took it out of the house. She takes Aleve prn. She does  not cook as much and denies leaving the stove on. She has noticed she misplaces things, she has to double check things more. Her sons report she is writing things down and making lists. She is independent with dressing and bathing, no hygiene concerns, house is taken care of. No paranoia or hallucinations. There was only one incident in the early Spring where she was convinced David's neighbor left a pile of wood in her neighbor's lawn, when he did not. No family history of dementia. No history  of concussions or alcohol use.  ?  ?She has had a daily headache for the past couple of months. She reports an achy pain on the vertex, like a headband, lasting a few hours. She does not take the Aleve daily for the headaches. There is no associated nausea/vomiting, photo/phonophobia, vision changes. She denies any dizziness, diplopia, dysarthria/dysphagia, bowel/bladder dysfunction, anosmia, or tremors. She reports pain going down her left arm and arthritis in both hands. Sleep is good, no wandering behavior. No falls. " ?  ? Neurocognitive Feedback Note (Dr. Alphonzo Severance, 05/26/20) : ?  ?"..We discussed impression of mild stage dementia. We had a long discussion about necessary supports at this stage of the disease, which in [name]'s case is mostly already being done, such as finance and medications. We also discussed settings that are appropriate for individuals with mild stage AD, including home with some assistance, assisted living, and independent living. [name] son's are concerned that she is quite isolated although she is hesitant to leave the home, and we processed that. Marland Kitchen.There are no safety concerns at present." ?  ? ? ?PREVIOUS MEDICATIONS:  ? ?CURRENT MEDICATIONS:  ?Outpatient Encounter Medications as of 12/19/2021  ?Medication Sig  ? aspirin 81 MG tablet Take 81 mg by mouth daily.  ? donepezil (ARICEPT) 10 MG tablet Take 1 tablet daily  ? gabapentin (NEURONTIN) 100 MG capsule Take 1 capsule (100 mg total) by mouth at bedtime.  ? memantine (NAMENDA) 10 MG tablet Take half tablet ('5mg'$  at night) for 2 weeks, then increase to 1 tablet (10 mg) nightly  ? Multiple Vitamins-Minerals (MULTIVITAMIN WITH MINERALS) tablet Take 1 tablet by mouth daily.  ? naproxen sodium (ANAPROX) 220 MG tablet Take 220 mg by mouth 2 (two) times daily with a meal.  ? vitamin C (ASCORBIC ACID) 500 MG tablet Take 500 mg by mouth daily.  ? vitamin E 400 UNIT capsule Take 400 Units by mouth daily.  ? ?No facility-administered  encounter medications on file as of 12/19/2021.  ? ? ? ?Objective:  ?  ? ?PHYSICAL EXAMINATION:   ? ?VITALS:   ?Vitals:  ? 12/19/21 1544  ?BP: 130/78  ?Pulse: 84  ?Resp: 18  ?SpO2: 98%  ?Weight: 145 lb (65.8 kg)  ?Height: '4\' 8"'$  (1.422 m)  ? ? ? ?GEN:  The patient appears stated age and is in NAD. ?HEENT:  Normocephalic, atraumatic.  ? ?Neurological examination: ? ?General: NAD, well-groomed, appears stated age. ?Orientation: The patient is alert. Oriented to person, place, but not to date.  Year is 2022 ?Cranial nerves: There is good facial symmetry. The speech is fluent and clear. No aphasia or dysarthria. Fund of knowledge is appropriate. Recent and remote memory are impaired. Attention and concentration are reduced.  Able to name objects 2/3 and repeat phrases.  Hearing is intact to conversational tone.   Delayed recall 0/3    ?Sensation: Sensation is intact to light touch throughout ?Motor: Strength is at least antigravity x4. ?Tremors:  none  ?DTR's 2/4 in UE/LE  ? ?  ?   ? View : No data to display.  ?  ?  ?  ? ? ?  06/21/2021  ?  8:00 AM 05/09/2020  ?  1:00 PM  ?MMSE - Mini Mental State Exam  ?Orientation to time 3 4  ?Orientation to Place 5 4  ?Registration 3 3  ?Attention/ Calculation 2 5  ?Recall 0 0  ?Language- name 2 objects 2 1  ?Language- repeat 1 0  ?Language- follow 3 step command 2 3  ?Language- read & follow direction 1 1  ?Write a sentence 1 1  ?Copy design 0 0  ?Total score 20 22  ?  ? ?  04/15/2020  ? 12:00 PM  ?[employer] Mental Exam  ?Weekday Correct 0  ?Current year 0  ?What state are we in? 1  ?Amount spent 0  ?Amount left 0  ?# of Animals 2  ?5 objects recall 0  ?Number series 2  ?Hour markers 2  ?Time correct 0  ?Placed X in triangle correctly 1  ?Largest Figure 1  ?Name of female 0  ?Date back to work 0  ?Type of work 0  ?State she lived in 0  ?Total score 9  ?  ? ?  ?Movement examination: ?Tone: There is normal tone in the UE/LE ?Abnormal movements:  no tremor.  No myoclonus.  No  asterixis.   ?Coordination:  There is no decremation with RAM's. Normal finger to nose  ?Gait and Station: The patient has no difficulty arising out of a deep-seated chair without the use of the hands. The pa

## 2021-12-21 ENCOUNTER — Telehealth: Payer: Self-pay | Admitting: Physician Assistant

## 2021-12-21 NOTE — Telephone Encounter (Signed)
I spoke with [facility] they will reach out to PCP. Thanked me for calling.  ?

## 2021-12-21 NOTE — Telephone Encounter (Signed)
Melody Wilson from [facility] called in wanting to see if he could get orders PT stregnth and gait instability, OT for assistance of ADLs, speech for cognition, nursing for medication management, and a home health aide to assist with bathing.  ?

## 2022-01-08 DIAGNOSIS — F0390 Unspecified dementia without behavioral disturbance: Secondary | ICD-10-CM | POA: Diagnosis not present

## 2022-01-11 DIAGNOSIS — M1710 Unilateral primary osteoarthritis, unspecified knee: Secondary | ICD-10-CM | POA: Diagnosis not present

## 2022-01-11 DIAGNOSIS — G301 Alzheimer's disease with late onset: Secondary | ICD-10-CM | POA: Diagnosis not present

## 2022-01-11 DIAGNOSIS — M810 Age-related osteoporosis without current pathological fracture: Secondary | ICD-10-CM | POA: Diagnosis not present

## 2022-01-11 DIAGNOSIS — F02A0 Dementia in other diseases classified elsewhere, mild, without behavioral disturbance, psychotic disturbance, mood disturbance, and anxiety: Secondary | ICD-10-CM | POA: Diagnosis not present

## 2022-01-11 DIAGNOSIS — M5137 Other intervertebral disc degeneration, lumbosacral region: Secondary | ICD-10-CM | POA: Diagnosis not present

## 2022-01-11 DIAGNOSIS — Z853 Personal history of malignant neoplasm of breast: Secondary | ICD-10-CM | POA: Diagnosis not present

## 2022-01-16 DIAGNOSIS — M1710 Unilateral primary osteoarthritis, unspecified knee: Secondary | ICD-10-CM | POA: Diagnosis not present

## 2022-01-16 DIAGNOSIS — F02A0 Dementia in other diseases classified elsewhere, mild, without behavioral disturbance, psychotic disturbance, mood disturbance, and anxiety: Secondary | ICD-10-CM | POA: Diagnosis not present

## 2022-01-16 DIAGNOSIS — G301 Alzheimer's disease with late onset: Secondary | ICD-10-CM | POA: Diagnosis not present

## 2022-01-16 DIAGNOSIS — Z853 Personal history of malignant neoplasm of breast: Secondary | ICD-10-CM | POA: Diagnosis not present

## 2022-01-16 DIAGNOSIS — M810 Age-related osteoporosis without current pathological fracture: Secondary | ICD-10-CM | POA: Diagnosis not present

## 2022-01-16 DIAGNOSIS — M5137 Other intervertebral disc degeneration, lumbosacral region: Secondary | ICD-10-CM | POA: Diagnosis not present

## 2022-01-24 DIAGNOSIS — F02A0 Dementia in other diseases classified elsewhere, mild, without behavioral disturbance, psychotic disturbance, mood disturbance, and anxiety: Secondary | ICD-10-CM | POA: Diagnosis not present

## 2022-01-24 DIAGNOSIS — Z853 Personal history of malignant neoplasm of breast: Secondary | ICD-10-CM | POA: Diagnosis not present

## 2022-01-24 DIAGNOSIS — M1710 Unilateral primary osteoarthritis, unspecified knee: Secondary | ICD-10-CM | POA: Diagnosis not present

## 2022-01-24 DIAGNOSIS — M810 Age-related osteoporosis without current pathological fracture: Secondary | ICD-10-CM | POA: Diagnosis not present

## 2022-01-24 DIAGNOSIS — M5137 Other intervertebral disc degeneration, lumbosacral region: Secondary | ICD-10-CM | POA: Diagnosis not present

## 2022-01-24 DIAGNOSIS — G301 Alzheimer's disease with late onset: Secondary | ICD-10-CM | POA: Diagnosis not present

## 2022-01-26 DIAGNOSIS — M1710 Unilateral primary osteoarthritis, unspecified knee: Secondary | ICD-10-CM | POA: Diagnosis not present

## 2022-01-26 DIAGNOSIS — M810 Age-related osteoporosis without current pathological fracture: Secondary | ICD-10-CM | POA: Diagnosis not present

## 2022-01-26 DIAGNOSIS — F02A0 Dementia in other diseases classified elsewhere, mild, without behavioral disturbance, psychotic disturbance, mood disturbance, and anxiety: Secondary | ICD-10-CM | POA: Diagnosis not present

## 2022-01-26 DIAGNOSIS — Z853 Personal history of malignant neoplasm of breast: Secondary | ICD-10-CM | POA: Diagnosis not present

## 2022-01-26 DIAGNOSIS — G301 Alzheimer's disease with late onset: Secondary | ICD-10-CM | POA: Diagnosis not present

## 2022-01-26 DIAGNOSIS — M5137 Other intervertebral disc degeneration, lumbosacral region: Secondary | ICD-10-CM | POA: Diagnosis not present

## 2022-02-02 DIAGNOSIS — M1710 Unilateral primary osteoarthritis, unspecified knee: Secondary | ICD-10-CM | POA: Diagnosis not present

## 2022-02-02 DIAGNOSIS — F02A0 Dementia in other diseases classified elsewhere, mild, without behavioral disturbance, psychotic disturbance, mood disturbance, and anxiety: Secondary | ICD-10-CM | POA: Diagnosis not present

## 2022-02-02 DIAGNOSIS — M5137 Other intervertebral disc degeneration, lumbosacral region: Secondary | ICD-10-CM | POA: Diagnosis not present

## 2022-02-02 DIAGNOSIS — G301 Alzheimer's disease with late onset: Secondary | ICD-10-CM | POA: Diagnosis not present

## 2022-02-02 DIAGNOSIS — Z853 Personal history of malignant neoplasm of breast: Secondary | ICD-10-CM | POA: Diagnosis not present

## 2022-02-02 DIAGNOSIS — M810 Age-related osteoporosis without current pathological fracture: Secondary | ICD-10-CM | POA: Diagnosis not present

## 2022-02-06 DIAGNOSIS — F02A0 Dementia in other diseases classified elsewhere, mild, without behavioral disturbance, psychotic disturbance, mood disturbance, and anxiety: Secondary | ICD-10-CM | POA: Diagnosis not present

## 2022-02-06 DIAGNOSIS — M810 Age-related osteoporosis without current pathological fracture: Secondary | ICD-10-CM | POA: Diagnosis not present

## 2022-02-06 DIAGNOSIS — M5137 Other intervertebral disc degeneration, lumbosacral region: Secondary | ICD-10-CM | POA: Diagnosis not present

## 2022-02-06 DIAGNOSIS — G301 Alzheimer's disease with late onset: Secondary | ICD-10-CM | POA: Diagnosis not present

## 2022-02-06 DIAGNOSIS — M1710 Unilateral primary osteoarthritis, unspecified knee: Secondary | ICD-10-CM | POA: Diagnosis not present

## 2022-02-06 DIAGNOSIS — Z853 Personal history of malignant neoplasm of breast: Secondary | ICD-10-CM | POA: Diagnosis not present

## 2022-02-09 DIAGNOSIS — M5137 Other intervertebral disc degeneration, lumbosacral region: Secondary | ICD-10-CM | POA: Diagnosis not present

## 2022-02-09 DIAGNOSIS — M810 Age-related osteoporosis without current pathological fracture: Secondary | ICD-10-CM | POA: Diagnosis not present

## 2022-02-09 DIAGNOSIS — G301 Alzheimer's disease with late onset: Secondary | ICD-10-CM | POA: Diagnosis not present

## 2022-02-09 DIAGNOSIS — Z853 Personal history of malignant neoplasm of breast: Secondary | ICD-10-CM | POA: Diagnosis not present

## 2022-02-09 DIAGNOSIS — M1710 Unilateral primary osteoarthritis, unspecified knee: Secondary | ICD-10-CM | POA: Diagnosis not present

## 2022-02-09 DIAGNOSIS — F02A0 Dementia in other diseases classified elsewhere, mild, without behavioral disturbance, psychotic disturbance, mood disturbance, and anxiety: Secondary | ICD-10-CM | POA: Diagnosis not present

## 2022-02-10 DIAGNOSIS — F02A0 Dementia in other diseases classified elsewhere, mild, without behavioral disturbance, psychotic disturbance, mood disturbance, and anxiety: Secondary | ICD-10-CM | POA: Diagnosis not present

## 2022-02-10 DIAGNOSIS — G301 Alzheimer's disease with late onset: Secondary | ICD-10-CM | POA: Diagnosis not present

## 2022-02-10 DIAGNOSIS — M810 Age-related osteoporosis without current pathological fracture: Secondary | ICD-10-CM | POA: Diagnosis not present

## 2022-02-10 DIAGNOSIS — M1710 Unilateral primary osteoarthritis, unspecified knee: Secondary | ICD-10-CM | POA: Diagnosis not present

## 2022-02-10 DIAGNOSIS — M5137 Other intervertebral disc degeneration, lumbosacral region: Secondary | ICD-10-CM | POA: Diagnosis not present

## 2022-02-10 DIAGNOSIS — Z853 Personal history of malignant neoplasm of breast: Secondary | ICD-10-CM | POA: Diagnosis not present

## 2022-02-13 DIAGNOSIS — M810 Age-related osteoporosis without current pathological fracture: Secondary | ICD-10-CM | POA: Diagnosis not present

## 2022-02-13 DIAGNOSIS — M5137 Other intervertebral disc degeneration, lumbosacral region: Secondary | ICD-10-CM | POA: Diagnosis not present

## 2022-02-13 DIAGNOSIS — Z853 Personal history of malignant neoplasm of breast: Secondary | ICD-10-CM | POA: Diagnosis not present

## 2022-02-13 DIAGNOSIS — G301 Alzheimer's disease with late onset: Secondary | ICD-10-CM | POA: Diagnosis not present

## 2022-02-13 DIAGNOSIS — M1710 Unilateral primary osteoarthritis, unspecified knee: Secondary | ICD-10-CM | POA: Diagnosis not present

## 2022-02-13 DIAGNOSIS — F02A0 Dementia in other diseases classified elsewhere, mild, without behavioral disturbance, psychotic disturbance, mood disturbance, and anxiety: Secondary | ICD-10-CM | POA: Diagnosis not present

## 2022-02-23 DIAGNOSIS — Z853 Personal history of malignant neoplasm of breast: Secondary | ICD-10-CM | POA: Diagnosis not present

## 2022-02-23 DIAGNOSIS — F02A0 Dementia in other diseases classified elsewhere, mild, without behavioral disturbance, psychotic disturbance, mood disturbance, and anxiety: Secondary | ICD-10-CM | POA: Diagnosis not present

## 2022-02-23 DIAGNOSIS — M5137 Other intervertebral disc degeneration, lumbosacral region: Secondary | ICD-10-CM | POA: Diagnosis not present

## 2022-02-23 DIAGNOSIS — G301 Alzheimer's disease with late onset: Secondary | ICD-10-CM | POA: Diagnosis not present

## 2022-02-23 DIAGNOSIS — M1710 Unilateral primary osteoarthritis, unspecified knee: Secondary | ICD-10-CM | POA: Diagnosis not present

## 2022-02-23 DIAGNOSIS — M810 Age-related osteoporosis without current pathological fracture: Secondary | ICD-10-CM | POA: Diagnosis not present

## 2022-02-26 DIAGNOSIS — F02A0 Dementia in other diseases classified elsewhere, mild, without behavioral disturbance, psychotic disturbance, mood disturbance, and anxiety: Secondary | ICD-10-CM | POA: Diagnosis not present

## 2022-02-26 DIAGNOSIS — M810 Age-related osteoporosis without current pathological fracture: Secondary | ICD-10-CM | POA: Diagnosis not present

## 2022-02-26 DIAGNOSIS — G301 Alzheimer's disease with late onset: Secondary | ICD-10-CM | POA: Diagnosis not present

## 2022-02-26 DIAGNOSIS — M1710 Unilateral primary osteoarthritis, unspecified knee: Secondary | ICD-10-CM | POA: Diagnosis not present

## 2022-02-26 DIAGNOSIS — Z853 Personal history of malignant neoplasm of breast: Secondary | ICD-10-CM | POA: Diagnosis not present

## 2022-02-26 DIAGNOSIS — M5137 Other intervertebral disc degeneration, lumbosacral region: Secondary | ICD-10-CM | POA: Diagnosis not present

## 2022-03-01 DIAGNOSIS — Z853 Personal history of malignant neoplasm of breast: Secondary | ICD-10-CM | POA: Diagnosis not present

## 2022-03-01 DIAGNOSIS — M1710 Unilateral primary osteoarthritis, unspecified knee: Secondary | ICD-10-CM | POA: Diagnosis not present

## 2022-03-01 DIAGNOSIS — M810 Age-related osteoporosis without current pathological fracture: Secondary | ICD-10-CM | POA: Diagnosis not present

## 2022-03-01 DIAGNOSIS — G301 Alzheimer's disease with late onset: Secondary | ICD-10-CM | POA: Diagnosis not present

## 2022-03-01 DIAGNOSIS — M5137 Other intervertebral disc degeneration, lumbosacral region: Secondary | ICD-10-CM | POA: Diagnosis not present

## 2022-03-01 DIAGNOSIS — F02A0 Dementia in other diseases classified elsewhere, mild, without behavioral disturbance, psychotic disturbance, mood disturbance, and anxiety: Secondary | ICD-10-CM | POA: Diagnosis not present

## 2022-03-07 ENCOUNTER — Ambulatory Visit: Payer: Medicare Other | Admitting: Podiatry

## 2022-03-08 DIAGNOSIS — G301 Alzheimer's disease with late onset: Secondary | ICD-10-CM | POA: Diagnosis not present

## 2022-03-08 DIAGNOSIS — M1710 Unilateral primary osteoarthritis, unspecified knee: Secondary | ICD-10-CM | POA: Diagnosis not present

## 2022-03-08 DIAGNOSIS — F02A0 Dementia in other diseases classified elsewhere, mild, without behavioral disturbance, psychotic disturbance, mood disturbance, and anxiety: Secondary | ICD-10-CM | POA: Diagnosis not present

## 2022-03-08 DIAGNOSIS — M810 Age-related osteoporosis without current pathological fracture: Secondary | ICD-10-CM | POA: Diagnosis not present

## 2022-03-08 DIAGNOSIS — M5137 Other intervertebral disc degeneration, lumbosacral region: Secondary | ICD-10-CM | POA: Diagnosis not present

## 2022-03-08 DIAGNOSIS — Z853 Personal history of malignant neoplasm of breast: Secondary | ICD-10-CM | POA: Diagnosis not present

## 2022-03-09 DIAGNOSIS — F02A0 Dementia in other diseases classified elsewhere, mild, without behavioral disturbance, psychotic disturbance, mood disturbance, and anxiety: Secondary | ICD-10-CM | POA: Diagnosis not present

## 2022-03-09 DIAGNOSIS — M5137 Other intervertebral disc degeneration, lumbosacral region: Secondary | ICD-10-CM | POA: Diagnosis not present

## 2022-03-09 DIAGNOSIS — G301 Alzheimer's disease with late onset: Secondary | ICD-10-CM | POA: Diagnosis not present

## 2022-03-09 DIAGNOSIS — Z853 Personal history of malignant neoplasm of breast: Secondary | ICD-10-CM | POA: Diagnosis not present

## 2022-03-09 DIAGNOSIS — M1710 Unilateral primary osteoarthritis, unspecified knee: Secondary | ICD-10-CM | POA: Diagnosis not present

## 2022-03-09 DIAGNOSIS — M810 Age-related osteoporosis without current pathological fracture: Secondary | ICD-10-CM | POA: Diagnosis not present

## 2022-03-09 IMAGING — DX DG HIP (WITH OR WITHOUT PELVIS) 2-3V*L*
2 series · 3 of 3 positions shown · non-contrast
Comparison: April 11, 2005.

CLINICAL DATA: Left hip pain without known injury.

EXAM:
DG HIP (WITH OR WITHOUT PELVIS) 2-3V LEFT

[pelvis]
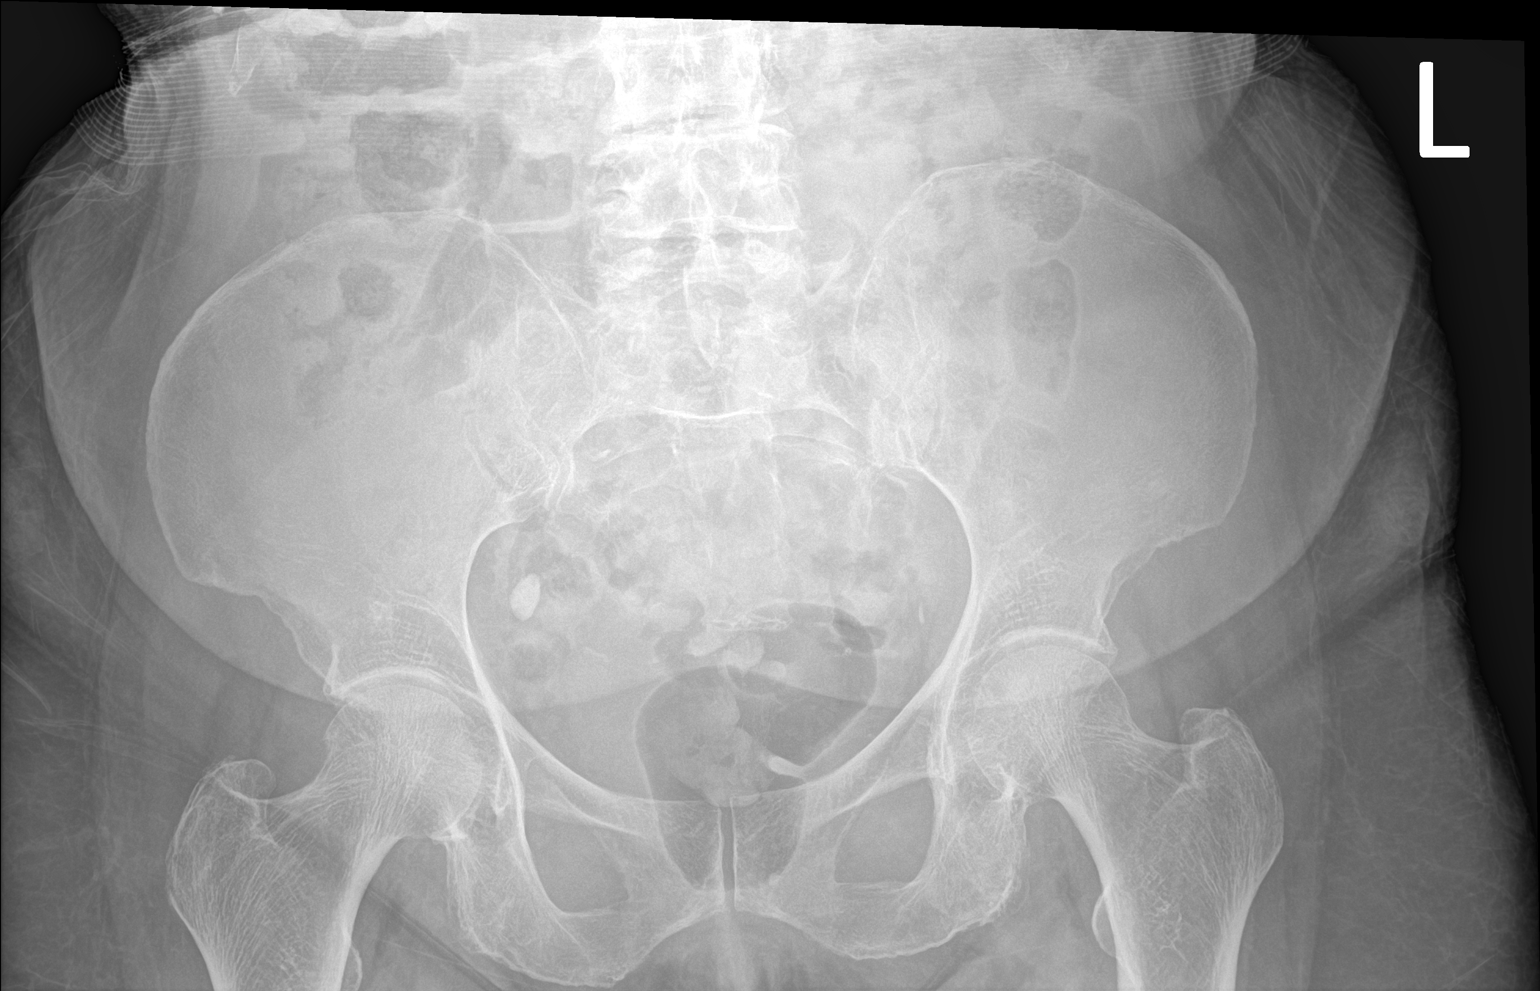

[Series 2: hip · 0.14mm/px · 2 of 2 slices shown]
[im 1/2]
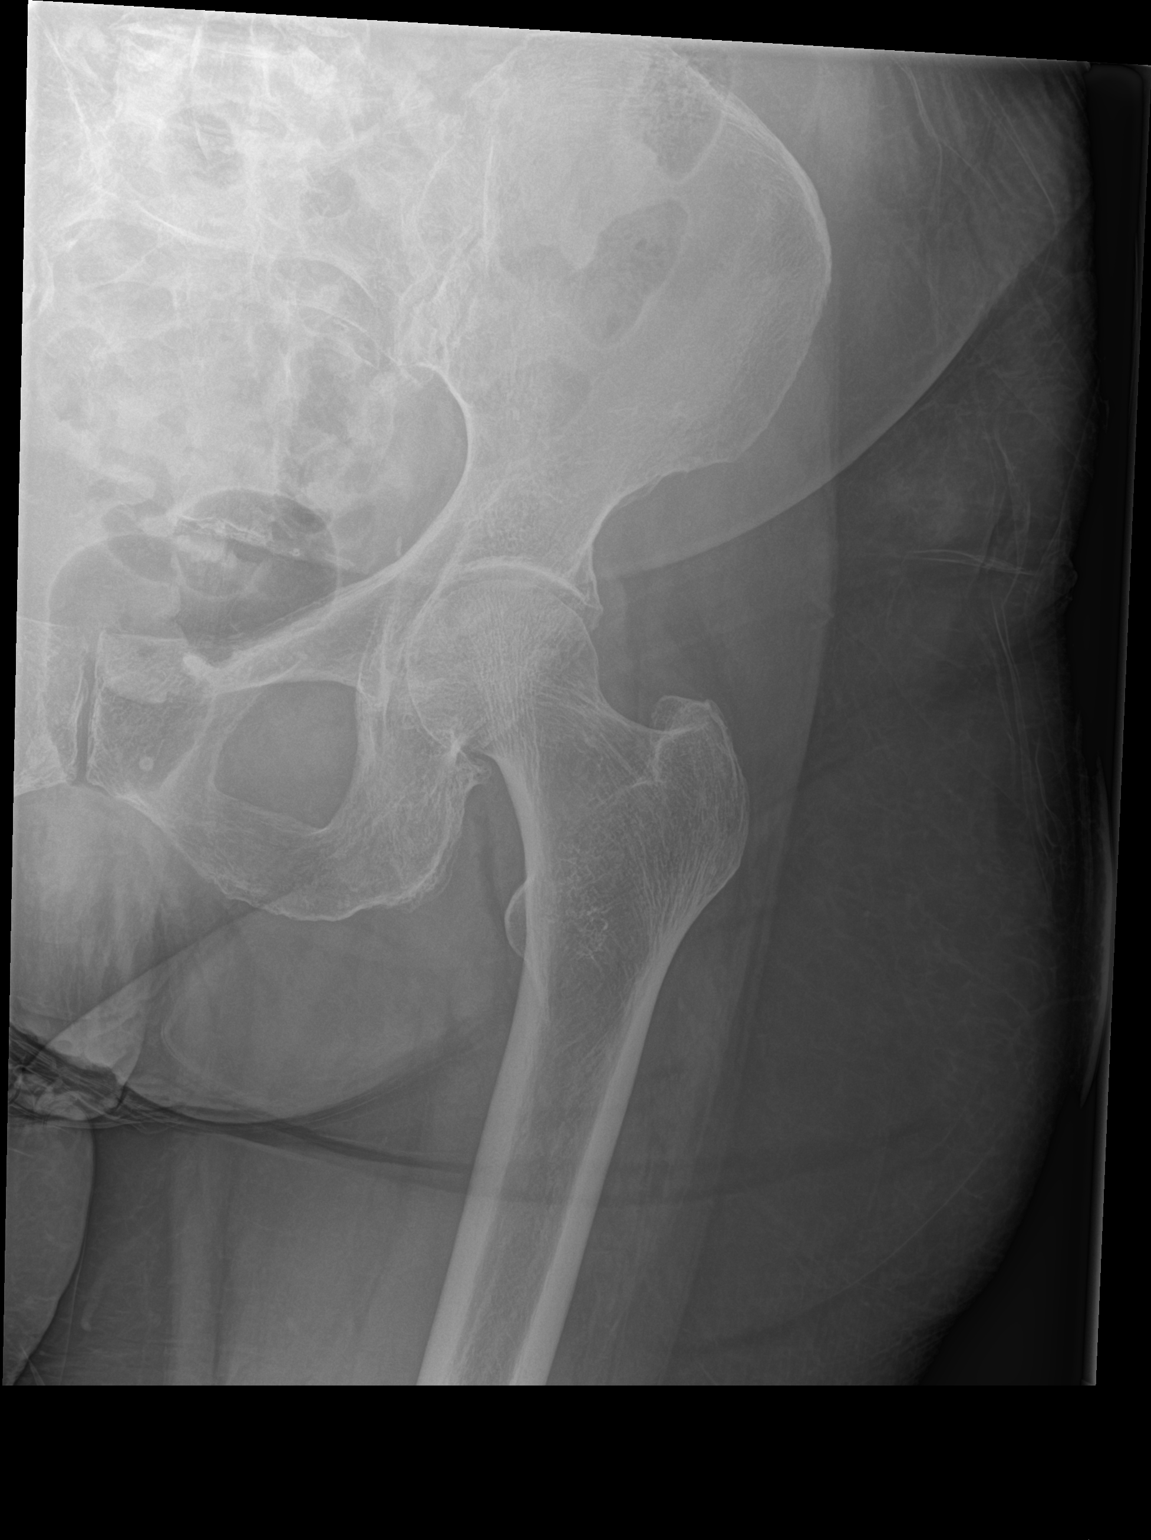
[im 2/2]
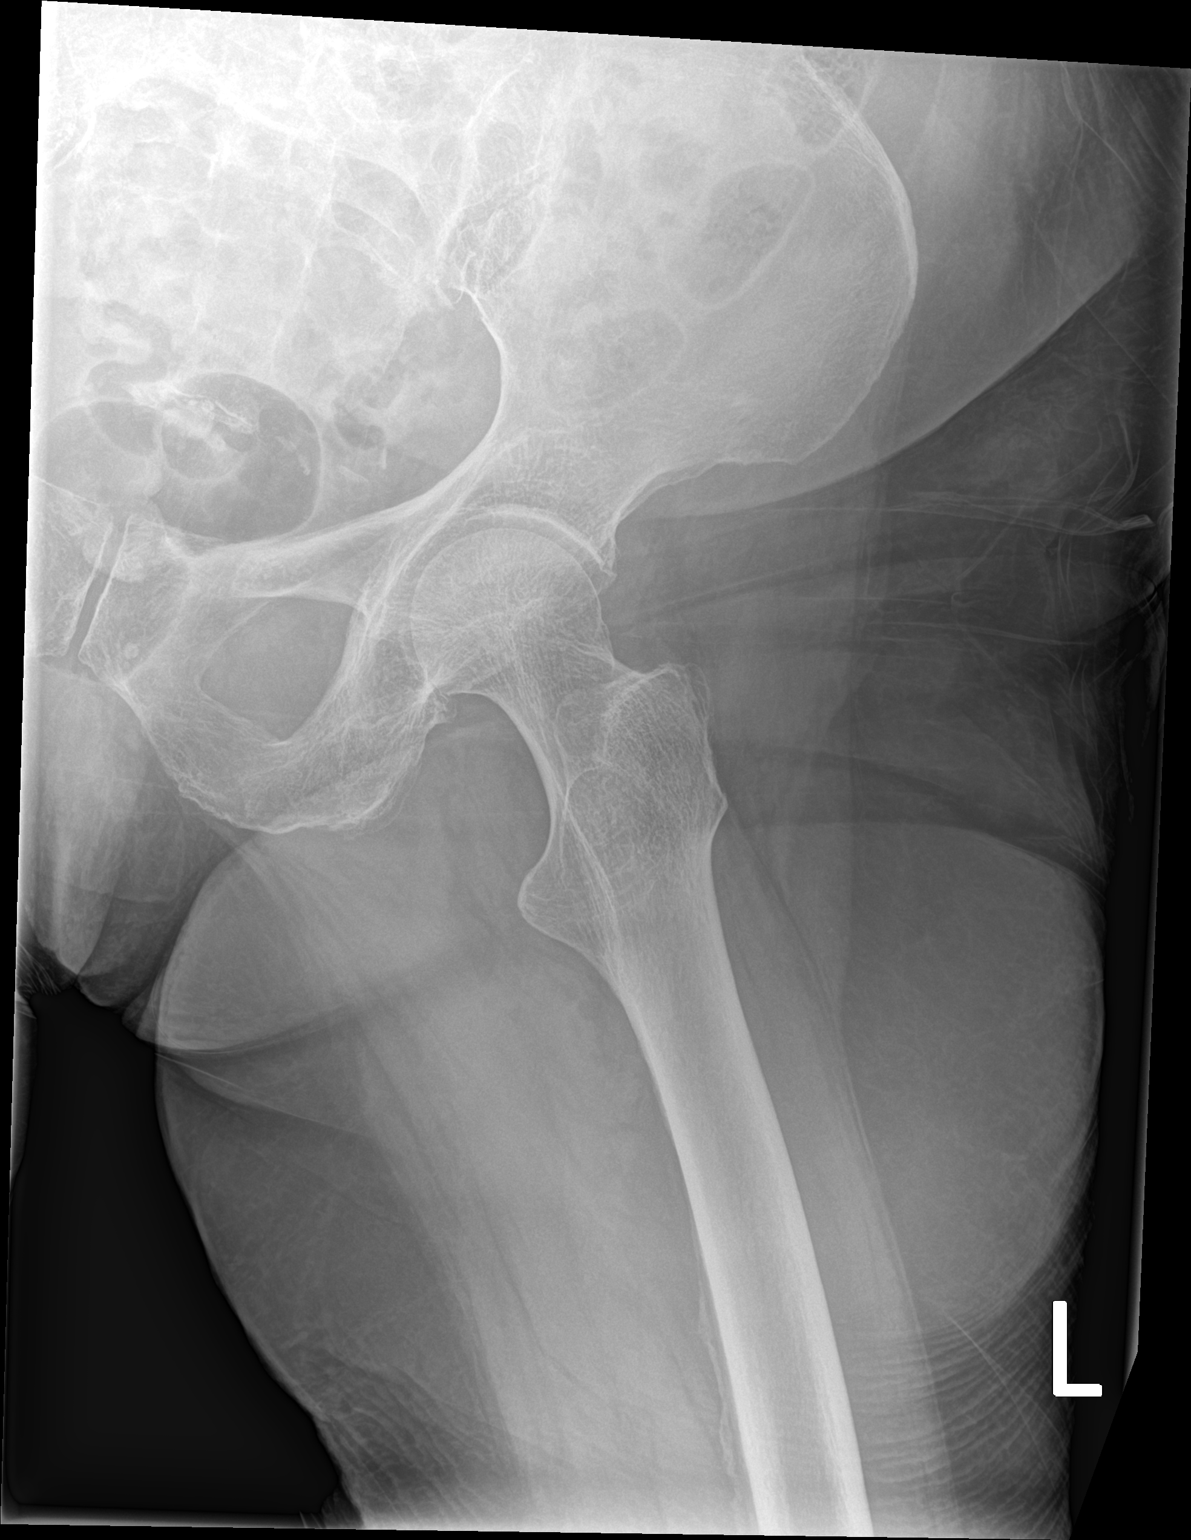

[3 of 3 positions shown; findings below may reference images not displayed]

FINDINGS: There is no evidence of hip fracture or dislocation. There is no
evidence of arthropathy or other focal bone abnormality.
IMPRESSION: Negative.

## 2022-03-12 DIAGNOSIS — F02A0 Dementia in other diseases classified elsewhere, mild, without behavioral disturbance, psychotic disturbance, mood disturbance, and anxiety: Secondary | ICD-10-CM | POA: Diagnosis not present

## 2022-03-12 DIAGNOSIS — R41841 Cognitive communication deficit: Secondary | ICD-10-CM | POA: Diagnosis not present

## 2022-03-12 DIAGNOSIS — M810 Age-related osteoporosis without current pathological fracture: Secondary | ICD-10-CM | POA: Diagnosis not present

## 2022-03-12 DIAGNOSIS — M1710 Unilateral primary osteoarthritis, unspecified knee: Secondary | ICD-10-CM | POA: Diagnosis not present

## 2022-03-12 DIAGNOSIS — G301 Alzheimer's disease with late onset: Secondary | ICD-10-CM | POA: Diagnosis not present

## 2022-03-12 DIAGNOSIS — M5137 Other intervertebral disc degeneration, lumbosacral region: Secondary | ICD-10-CM | POA: Diagnosis not present

## 2022-03-12 DIAGNOSIS — Z853 Personal history of malignant neoplasm of breast: Secondary | ICD-10-CM | POA: Diagnosis not present

## 2022-03-13 DIAGNOSIS — M810 Age-related osteoporosis without current pathological fracture: Secondary | ICD-10-CM | POA: Diagnosis not present

## 2022-03-13 DIAGNOSIS — M5137 Other intervertebral disc degeneration, lumbosacral region: Secondary | ICD-10-CM | POA: Diagnosis not present

## 2022-03-13 DIAGNOSIS — F02A0 Dementia in other diseases classified elsewhere, mild, without behavioral disturbance, psychotic disturbance, mood disturbance, and anxiety: Secondary | ICD-10-CM | POA: Diagnosis not present

## 2022-03-13 DIAGNOSIS — M1710 Unilateral primary osteoarthritis, unspecified knee: Secondary | ICD-10-CM | POA: Diagnosis not present

## 2022-03-13 DIAGNOSIS — Z853 Personal history of malignant neoplasm of breast: Secondary | ICD-10-CM | POA: Diagnosis not present

## 2022-03-13 DIAGNOSIS — G301 Alzheimer's disease with late onset: Secondary | ICD-10-CM | POA: Diagnosis not present

## 2022-03-19 DIAGNOSIS — M810 Age-related osteoporosis without current pathological fracture: Secondary | ICD-10-CM | POA: Diagnosis not present

## 2022-03-19 DIAGNOSIS — G301 Alzheimer's disease with late onset: Secondary | ICD-10-CM | POA: Diagnosis not present

## 2022-03-19 DIAGNOSIS — Z853 Personal history of malignant neoplasm of breast: Secondary | ICD-10-CM | POA: Diagnosis not present

## 2022-03-19 DIAGNOSIS — M5137 Other intervertebral disc degeneration, lumbosacral region: Secondary | ICD-10-CM | POA: Diagnosis not present

## 2022-03-19 DIAGNOSIS — M1710 Unilateral primary osteoarthritis, unspecified knee: Secondary | ICD-10-CM | POA: Diagnosis not present

## 2022-03-19 DIAGNOSIS — F02A0 Dementia in other diseases classified elsewhere, mild, without behavioral disturbance, psychotic disturbance, mood disturbance, and anxiety: Secondary | ICD-10-CM | POA: Diagnosis not present

## 2022-03-20 ENCOUNTER — Ambulatory Visit (INDEPENDENT_AMBULATORY_CARE_PROVIDER_SITE_OTHER): Payer: Medicare Other | Admitting: Podiatry

## 2022-03-20 ENCOUNTER — Encounter: Payer: Self-pay | Admitting: Podiatry

## 2022-03-20 DIAGNOSIS — F02A0 Dementia in other diseases classified elsewhere, mild, without behavioral disturbance, psychotic disturbance, mood disturbance, and anxiety: Secondary | ICD-10-CM | POA: Diagnosis not present

## 2022-03-20 DIAGNOSIS — B351 Tinea unguium: Secondary | ICD-10-CM | POA: Diagnosis not present

## 2022-03-20 DIAGNOSIS — M79675 Pain in left toe(s): Secondary | ICD-10-CM

## 2022-03-20 DIAGNOSIS — M1710 Unilateral primary osteoarthritis, unspecified knee: Secondary | ICD-10-CM | POA: Diagnosis not present

## 2022-03-20 DIAGNOSIS — M810 Age-related osteoporosis without current pathological fracture: Secondary | ICD-10-CM | POA: Diagnosis not present

## 2022-03-20 DIAGNOSIS — M79674 Pain in right toe(s): Secondary | ICD-10-CM | POA: Diagnosis not present

## 2022-03-20 DIAGNOSIS — Z853 Personal history of malignant neoplasm of breast: Secondary | ICD-10-CM | POA: Diagnosis not present

## 2022-03-20 DIAGNOSIS — G301 Alzheimer's disease with late onset: Secondary | ICD-10-CM | POA: Diagnosis not present

## 2022-03-20 DIAGNOSIS — M5137 Other intervertebral disc degeneration, lumbosacral region: Secondary | ICD-10-CM | POA: Diagnosis not present

## 2022-03-20 NOTE — Progress Notes (Signed)
This patient presents to the office with chief complaint of long thick painful nails.  Patient says the nails are painful walking and wearing shoes.  This patient is unable to self treat.  This patient is unable to trim her nails since she is unable to reach her nails. She presents to the office with her daughter. She presents to the office for preventative foot care services.   General Appearance  Alert, conversant and in no acute stress.  Vascular  Dorsalis pedis and posterior tibial  pulses are weakly  palpable  bilaterally.  Capillary return is within normal limits  bilaterally. Temperature is within normal limits  bilaterally.  Neurologic  Senn-Weinstein monofilament wire test within normal limits  bilaterally. Muscle power within normal limits bilaterally.  Nails Thick disfigured discolored nails with subungual debris  from hallux to fifth toes bilaterally. No evidence of bacterial infection or drainage bilaterally.  Orthopedic  No limitations of motion  feet .  No crepitus or effusions noted.  No bony pathology or digital deformities noted.  Skin  normotropic skin with no porokeratosis noted bilaterally.  No signs of infections or ulcers noted.     Onychomycosis  Nails  B/L.  Pain in right toes  Pain in left toes  Debridement of nails both feet followed trimming the nails with dremel tool.    RTC 3 months.   Analya Louissaint DPM   

## 2022-03-27 DIAGNOSIS — M1710 Unilateral primary osteoarthritis, unspecified knee: Secondary | ICD-10-CM | POA: Diagnosis not present

## 2022-03-27 DIAGNOSIS — Z853 Personal history of malignant neoplasm of breast: Secondary | ICD-10-CM | POA: Diagnosis not present

## 2022-03-27 DIAGNOSIS — M810 Age-related osteoporosis without current pathological fracture: Secondary | ICD-10-CM | POA: Diagnosis not present

## 2022-03-27 DIAGNOSIS — F02A0 Dementia in other diseases classified elsewhere, mild, without behavioral disturbance, psychotic disturbance, mood disturbance, and anxiety: Secondary | ICD-10-CM | POA: Diagnosis not present

## 2022-03-27 DIAGNOSIS — M5137 Other intervertebral disc degeneration, lumbosacral region: Secondary | ICD-10-CM | POA: Diagnosis not present

## 2022-03-27 DIAGNOSIS — G301 Alzheimer's disease with late onset: Secondary | ICD-10-CM | POA: Diagnosis not present

## 2022-03-30 DIAGNOSIS — F02A0 Dementia in other diseases classified elsewhere, mild, without behavioral disturbance, psychotic disturbance, mood disturbance, and anxiety: Secondary | ICD-10-CM | POA: Diagnosis not present

## 2022-03-30 DIAGNOSIS — M810 Age-related osteoporosis without current pathological fracture: Secondary | ICD-10-CM | POA: Diagnosis not present

## 2022-03-30 DIAGNOSIS — G301 Alzheimer's disease with late onset: Secondary | ICD-10-CM | POA: Diagnosis not present

## 2022-03-30 DIAGNOSIS — Z853 Personal history of malignant neoplasm of breast: Secondary | ICD-10-CM | POA: Diagnosis not present

## 2022-03-30 DIAGNOSIS — M5137 Other intervertebral disc degeneration, lumbosacral region: Secondary | ICD-10-CM | POA: Diagnosis not present

## 2022-03-30 DIAGNOSIS — M1710 Unilateral primary osteoarthritis, unspecified knee: Secondary | ICD-10-CM | POA: Diagnosis not present

## 2022-04-03 DIAGNOSIS — M810 Age-related osteoporosis without current pathological fracture: Secondary | ICD-10-CM | POA: Diagnosis not present

## 2022-04-03 DIAGNOSIS — F02A0 Dementia in other diseases classified elsewhere, mild, without behavioral disturbance, psychotic disturbance, mood disturbance, and anxiety: Secondary | ICD-10-CM | POA: Diagnosis not present

## 2022-04-03 DIAGNOSIS — M1710 Unilateral primary osteoarthritis, unspecified knee: Secondary | ICD-10-CM | POA: Diagnosis not present

## 2022-04-03 DIAGNOSIS — G301 Alzheimer's disease with late onset: Secondary | ICD-10-CM | POA: Diagnosis not present

## 2022-04-03 DIAGNOSIS — Z853 Personal history of malignant neoplasm of breast: Secondary | ICD-10-CM | POA: Diagnosis not present

## 2022-04-03 DIAGNOSIS — M5137 Other intervertebral disc degeneration, lumbosacral region: Secondary | ICD-10-CM | POA: Diagnosis not present

## 2022-04-10 DIAGNOSIS — G301 Alzheimer's disease with late onset: Secondary | ICD-10-CM | POA: Diagnosis not present

## 2022-04-10 DIAGNOSIS — F02A0 Dementia in other diseases classified elsewhere, mild, without behavioral disturbance, psychotic disturbance, mood disturbance, and anxiety: Secondary | ICD-10-CM | POA: Diagnosis not present

## 2022-04-10 DIAGNOSIS — M5137 Other intervertebral disc degeneration, lumbosacral region: Secondary | ICD-10-CM | POA: Diagnosis not present

## 2022-04-10 DIAGNOSIS — Z853 Personal history of malignant neoplasm of breast: Secondary | ICD-10-CM | POA: Diagnosis not present

## 2022-04-10 DIAGNOSIS — M1710 Unilateral primary osteoarthritis, unspecified knee: Secondary | ICD-10-CM | POA: Diagnosis not present

## 2022-04-10 DIAGNOSIS — M810 Age-related osteoporosis without current pathological fracture: Secondary | ICD-10-CM | POA: Diagnosis not present

## 2022-04-11 DIAGNOSIS — M810 Age-related osteoporosis without current pathological fracture: Secondary | ICD-10-CM | POA: Diagnosis not present

## 2022-04-11 DIAGNOSIS — M1710 Unilateral primary osteoarthritis, unspecified knee: Secondary | ICD-10-CM | POA: Diagnosis not present

## 2022-04-11 DIAGNOSIS — R41841 Cognitive communication deficit: Secondary | ICD-10-CM | POA: Diagnosis not present

## 2022-04-11 DIAGNOSIS — Z853 Personal history of malignant neoplasm of breast: Secondary | ICD-10-CM | POA: Diagnosis not present

## 2022-04-11 DIAGNOSIS — G301 Alzheimer's disease with late onset: Secondary | ICD-10-CM | POA: Diagnosis not present

## 2022-04-11 DIAGNOSIS — M5137 Other intervertebral disc degeneration, lumbosacral region: Secondary | ICD-10-CM | POA: Diagnosis not present

## 2022-04-11 DIAGNOSIS — F02A0 Dementia in other diseases classified elsewhere, mild, without behavioral disturbance, psychotic disturbance, mood disturbance, and anxiety: Secondary | ICD-10-CM | POA: Diagnosis not present

## 2022-04-13 DIAGNOSIS — M810 Age-related osteoporosis without current pathological fracture: Secondary | ICD-10-CM | POA: Diagnosis not present

## 2022-04-13 DIAGNOSIS — M5137 Other intervertebral disc degeneration, lumbosacral region: Secondary | ICD-10-CM | POA: Diagnosis not present

## 2022-04-13 DIAGNOSIS — G301 Alzheimer's disease with late onset: Secondary | ICD-10-CM | POA: Diagnosis not present

## 2022-04-13 DIAGNOSIS — F02A0 Dementia in other diseases classified elsewhere, mild, without behavioral disturbance, psychotic disturbance, mood disturbance, and anxiety: Secondary | ICD-10-CM | POA: Diagnosis not present

## 2022-04-13 DIAGNOSIS — M1710 Unilateral primary osteoarthritis, unspecified knee: Secondary | ICD-10-CM | POA: Diagnosis not present

## 2022-04-13 DIAGNOSIS — Z853 Personal history of malignant neoplasm of breast: Secondary | ICD-10-CM | POA: Diagnosis not present

## 2022-04-16 DIAGNOSIS — F02A0 Dementia in other diseases classified elsewhere, mild, without behavioral disturbance, psychotic disturbance, mood disturbance, and anxiety: Secondary | ICD-10-CM | POA: Diagnosis not present

## 2022-04-16 DIAGNOSIS — G301 Alzheimer's disease with late onset: Secondary | ICD-10-CM | POA: Diagnosis not present

## 2022-04-16 DIAGNOSIS — Z853 Personal history of malignant neoplasm of breast: Secondary | ICD-10-CM | POA: Diagnosis not present

## 2022-04-16 DIAGNOSIS — M810 Age-related osteoporosis without current pathological fracture: Secondary | ICD-10-CM | POA: Diagnosis not present

## 2022-04-16 DIAGNOSIS — M1710 Unilateral primary osteoarthritis, unspecified knee: Secondary | ICD-10-CM | POA: Diagnosis not present

## 2022-04-16 DIAGNOSIS — M5137 Other intervertebral disc degeneration, lumbosacral region: Secondary | ICD-10-CM | POA: Diagnosis not present

## 2022-04-19 DIAGNOSIS — M1710 Unilateral primary osteoarthritis, unspecified knee: Secondary | ICD-10-CM | POA: Diagnosis not present

## 2022-04-19 DIAGNOSIS — Z853 Personal history of malignant neoplasm of breast: Secondary | ICD-10-CM | POA: Diagnosis not present

## 2022-04-19 DIAGNOSIS — M810 Age-related osteoporosis without current pathological fracture: Secondary | ICD-10-CM | POA: Diagnosis not present

## 2022-04-19 DIAGNOSIS — M5137 Other intervertebral disc degeneration, lumbosacral region: Secondary | ICD-10-CM | POA: Diagnosis not present

## 2022-04-19 DIAGNOSIS — F02A0 Dementia in other diseases classified elsewhere, mild, without behavioral disturbance, psychotic disturbance, mood disturbance, and anxiety: Secondary | ICD-10-CM | POA: Diagnosis not present

## 2022-04-19 DIAGNOSIS — G301 Alzheimer's disease with late onset: Secondary | ICD-10-CM | POA: Diagnosis not present

## 2022-04-24 DIAGNOSIS — G301 Alzheimer's disease with late onset: Secondary | ICD-10-CM | POA: Diagnosis not present

## 2022-04-24 DIAGNOSIS — Z853 Personal history of malignant neoplasm of breast: Secondary | ICD-10-CM | POA: Diagnosis not present

## 2022-04-24 DIAGNOSIS — M5137 Other intervertebral disc degeneration, lumbosacral region: Secondary | ICD-10-CM | POA: Diagnosis not present

## 2022-04-24 DIAGNOSIS — F02A0 Dementia in other diseases classified elsewhere, mild, without behavioral disturbance, psychotic disturbance, mood disturbance, and anxiety: Secondary | ICD-10-CM | POA: Diagnosis not present

## 2022-04-24 DIAGNOSIS — M810 Age-related osteoporosis without current pathological fracture: Secondary | ICD-10-CM | POA: Diagnosis not present

## 2022-04-24 DIAGNOSIS — M1710 Unilateral primary osteoarthritis, unspecified knee: Secondary | ICD-10-CM | POA: Diagnosis not present

## 2022-04-25 DIAGNOSIS — M5137 Other intervertebral disc degeneration, lumbosacral region: Secondary | ICD-10-CM | POA: Diagnosis not present

## 2022-04-25 DIAGNOSIS — Z853 Personal history of malignant neoplasm of breast: Secondary | ICD-10-CM | POA: Diagnosis not present

## 2022-04-25 DIAGNOSIS — F02A0 Dementia in other diseases classified elsewhere, mild, without behavioral disturbance, psychotic disturbance, mood disturbance, and anxiety: Secondary | ICD-10-CM | POA: Diagnosis not present

## 2022-04-25 DIAGNOSIS — G301 Alzheimer's disease with late onset: Secondary | ICD-10-CM | POA: Diagnosis not present

## 2022-04-25 DIAGNOSIS — M1710 Unilateral primary osteoarthritis, unspecified knee: Secondary | ICD-10-CM | POA: Diagnosis not present

## 2022-04-25 DIAGNOSIS — M810 Age-related osteoporosis without current pathological fracture: Secondary | ICD-10-CM | POA: Diagnosis not present

## 2022-05-01 DIAGNOSIS — M810 Age-related osteoporosis without current pathological fracture: Secondary | ICD-10-CM | POA: Diagnosis not present

## 2022-05-01 DIAGNOSIS — M5137 Other intervertebral disc degeneration, lumbosacral region: Secondary | ICD-10-CM | POA: Diagnosis not present

## 2022-05-01 DIAGNOSIS — F02A0 Dementia in other diseases classified elsewhere, mild, without behavioral disturbance, psychotic disturbance, mood disturbance, and anxiety: Secondary | ICD-10-CM | POA: Diagnosis not present

## 2022-05-01 DIAGNOSIS — G301 Alzheimer's disease with late onset: Secondary | ICD-10-CM | POA: Diagnosis not present

## 2022-05-01 DIAGNOSIS — Z853 Personal history of malignant neoplasm of breast: Secondary | ICD-10-CM | POA: Diagnosis not present

## 2022-05-01 DIAGNOSIS — M1710 Unilateral primary osteoarthritis, unspecified knee: Secondary | ICD-10-CM | POA: Diagnosis not present

## 2022-05-04 DIAGNOSIS — Z23 Encounter for immunization: Secondary | ICD-10-CM | POA: Diagnosis not present

## 2022-05-08 DIAGNOSIS — M5137 Other intervertebral disc degeneration, lumbosacral region: Secondary | ICD-10-CM | POA: Diagnosis not present

## 2022-05-08 DIAGNOSIS — Z853 Personal history of malignant neoplasm of breast: Secondary | ICD-10-CM | POA: Diagnosis not present

## 2022-05-08 DIAGNOSIS — F02A0 Dementia in other diseases classified elsewhere, mild, without behavioral disturbance, psychotic disturbance, mood disturbance, and anxiety: Secondary | ICD-10-CM | POA: Diagnosis not present

## 2022-05-08 DIAGNOSIS — M1710 Unilateral primary osteoarthritis, unspecified knee: Secondary | ICD-10-CM | POA: Diagnosis not present

## 2022-05-08 DIAGNOSIS — M810 Age-related osteoporosis without current pathological fracture: Secondary | ICD-10-CM | POA: Diagnosis not present

## 2022-05-08 DIAGNOSIS — G301 Alzheimer's disease with late onset: Secondary | ICD-10-CM | POA: Diagnosis not present

## 2022-06-20 ENCOUNTER — Encounter: Payer: Self-pay | Admitting: Podiatry

## 2022-06-20 ENCOUNTER — Ambulatory Visit (INDEPENDENT_AMBULATORY_CARE_PROVIDER_SITE_OTHER): Payer: Medicare Other | Admitting: Podiatry

## 2022-06-20 DIAGNOSIS — B351 Tinea unguium: Secondary | ICD-10-CM

## 2022-06-20 DIAGNOSIS — M79674 Pain in right toe(s): Secondary | ICD-10-CM

## 2022-06-20 DIAGNOSIS — M79675 Pain in left toe(s): Secondary | ICD-10-CM | POA: Diagnosis not present

## 2022-06-20 NOTE — Progress Notes (Signed)
This patient presents to the office with chief complaint of long thick painful nails.  Patient says the nails are painful walking and wearing shoes.  This patient is unable to self treat.  This patient is unable to trim her nails since she is unable to reach her nails. She presents to the office with her daughter. She presents to the office for preventative foot care services.   General Appearance  Alert, conversant and in no acute stress.  Vascular  Dorsalis pedis and posterior tibial  pulses are weakly  palpable  bilaterally.  Capillary return is within normal limits  bilaterally. Temperature is within normal limits  bilaterally.  Neurologic  Senn-Weinstein monofilament wire test within normal limits  bilaterally. Muscle power within normal limits bilaterally.  Nails Thick disfigured discolored nails with subungual debris  from hallux to fifth toes bilaterally. No evidence of bacterial infection or drainage bilaterally.  Orthopedic  No limitations of motion  feet .  No crepitus or effusions noted.  No bony pathology or digital deformities noted.  Skin  normotropic skin with no porokeratosis noted bilaterally.  No signs of infections or ulcers noted.     Onychomycosis  Nails  B/L.  Pain in right toes  Pain in left toes  Debridement of nails both feet followed trimming the nails with dremel tool.    RTC 3 months.   Gardiner Barefoot DPM

## 2022-06-22 ENCOUNTER — Encounter: Payer: Self-pay | Admitting: Physician Assistant

## 2022-06-22 ENCOUNTER — Ambulatory Visit (INDEPENDENT_AMBULATORY_CARE_PROVIDER_SITE_OTHER): Payer: Medicare Other | Admitting: Physician Assistant

## 2022-06-22 VITALS — HR 60 | Resp 18 | Wt 153.0 lb

## 2022-06-22 DIAGNOSIS — F0280 Dementia in other diseases classified elsewhere without behavioral disturbance: Secondary | ICD-10-CM

## 2022-06-22 DIAGNOSIS — G301 Alzheimer's disease with late onset: Secondary | ICD-10-CM | POA: Diagnosis not present

## 2022-06-22 NOTE — Patient Instructions (Addendum)
It was a pleasure to see you today at our office.   Recommendations:  Follow up in 6 months. Continue donepezil 10 mg daily. Side effects were discussed  Continue Memantine 10 mg twice daily. Side effects were discussed  Recommend good control of cardiovascular risk factors.   Continue to control mood as per PCP  Whom to call:  Memory  decline, memory medications: Call our office [phone]   For psychiatric meds, mood meds: Please have your primary care physician manage these medications.   Counseling regarding caregiver distress, including caregiver depression, anxiety and issues regarding community resources, adult day care programs, adult living facilities, or memory care questions:   Feel free to contact [place], Social Worker at [phone]   For assessment of decision of mental capacity and competency:  Call Dr. Anthoney Harada, geriatric psychiatrist at [phone]  For guidance in geriatric dementia issues please call [facility] [phone]  For guidance regarding [facility] and if placement were needed at the facility, contact Arnell Asal, Social Worker tel: [phone]  If you have any severe symptoms of a stroke, or other severe issues such as confusion,severe chills or fever, etc call 911 or go to the ER as you may need to be evaluated further   Feel free to visit Facebook page " [email or web address]" for tips of how to care for people with memory problems.     RECOMMENDATIONS FOR ALL PATIENTS WITH MEMORY PROBLEMS: 1. Continue to exercise (Recommend 30 minutes of walking everyday, or 3 hours every week) 2. Increase social interactions - continue going to [place] and enjoy social gatherings with friends and family 3. Eat healthy, avoid fried foods and eat more fruits and vegetables 4. Maintain adequate blood pressure, blood sugar, and blood cholesterol level. Reducing the risk of stroke and cardiovascular disease also helps  promoting better memory. 5. Avoid stressful situations. Live a simple life and avoid aggravations. Organize your time and prepare for the next day in anticipation. 6. Sleep well, avoid any interruptions of sleep and avoid any distractions in the bedroom that may interfere with adequate sleep quality 7. Avoid sugar, avoid sweets as there is a strong link between excessive sugar intake, diabetes, and cognitive impairment We discussed the Mediterranean diet, which has been shown to help patients reduce the risk of progressive memory disorders and reduces cardiovascular risk. This includes eating fish, eat fruits and green leafy vegetables, nuts like almonds and hazelnuts, walnuts, and also use olive oil. Avoid fast foods and fried foods as much as possible. Avoid sweets and sugar as sugar use has been linked to worsening of memory function.  There is always a concern of gradual progression of memory problems. If this is the case, then we may need to adjust level of care according to patient needs. Support, both to the patient and caregiver, should then be put into place.    FALL PRECAUTIONS: Be cautious when walking. Scan the area for obstacles that may increase the risk of trips and falls. When getting up in the mornings, sit up at the edge of the bed for a few minutes before getting out of bed. Consider elevating the bed at the head end to avoid drop of blood pressure when getting up. Walk always in a well-lit room (use night lights in the walls). Avoid area rugs or power cords from appliances in the middle of the walkways. Use a walker or a cane if necessary and consider physical therapy for balance exercise. Get  your eyesight checked regularly.  FINANCIAL OVERSIGHT: Supervision, especially oversight when making financial decisions or transactions is also recommended.  HOME SAFETY: Consider the safety of the kitchen when operating appliances like stoves, microwave oven, and blender. Consider having  supervision and share cooking responsibilities until no longer able to participate in those. Accidents with firearms and other hazards in the house should be identified and addressed as well.   ABILITY TO BE LEFT ALONE: If patient is unable to contact 911 operator, consider using [employer], or when the need is there, arrange for someone to stay with patients. Smoking is a fire hazard, consider supervision or cessation. Risk of wandering should be assessed by caregiver and if detected at any point, supervision and safe proof recommendations should be instituted.  MEDICATION SUPERVISION: Inability to self-administer medication needs to be constantly addressed. Implement a mechanism to ensure safe administration of the medications.   DRIVING: Regarding driving, in patients with progressive memory problems, driving will be impaired. We advise to have someone else do the driving if trouble finding directions or if minor accidents are reported. Independent driving assessment is available to determine safety of driving.   If you are interested in the driving assessment, you can contact the following:  [employer] in [place]  [place] [phone]  [place]  [facility] [phone] or [phone]

## 2022-06-22 NOTE — Progress Notes (Signed)
Assessment/Plan:   Dementia likely due to Alzheimer's Disease, late onset  Melody Wilson is a very pleasant 88 y.o. RH female seen today in follow up for memory loss. Patient is currently on donepezil 10 mg daily and memantine 10 mg bid .  She is stable from the cognitive standpoint. Her MMSE today is 23/30.However, she may require more help with ADLs, especially with medications as she is non compliant with them. Family is contemplating ALF at this time.     Follow up in  6 months. Continue donepezil 10 mg daily. Side effects were discussed  Continue Memantine 10 mg twice daily. Side effects were discussed  Recommend good control of cardiovascular risk factors.   Continue to control mood as per PCP Agree with ALF for 24/7 monitoring   Subjective:    This patient is accompanied in the office by her son who supplements the history.  Previous records as well as any outside records available were reviewed prior to todays visit. Last seen on Dec 19, 2021    Any changes in memory since last visit? Insight is decreased, but overall there are no cognitive changes according to her son.  "She does not like to take the medicine, unless being told to take it"."  She does not like to do crosswords or word finding. repeats oneself?  Endorsed Disoriented when walking into a room?  Patient denies   Leaving objects in unusual places?  She tries to hide things all the time and then cannot find them Ambulates  with difficulty?   Patient denies   Recent falls?  Patient denies   Any head injuries?  Patient denies   History of seizures?   Patient denies   Wandering behavior?  Patient denies   Patient drives?   Patient no longer drives  Any mood changes since last visit?  Patient denies   Any worsening depression?:  Patient denies   Hallucinations?  Patient denies   Paranoia?  Patient denies   Patient reports that sleeps well without vivid dreams, REM behavior or sleepwalking   History of sleep  apnea?  Patient denies   Any hygiene concerns?  Patient denies   Independent of bathing and dressing?  Endorsed  Does the patient needs help with medications? Son tries to monitor, but as mentioned above, she is refusing to take all meds.  Who is in charge of the finances?  Son is in charge   Any changes in appetite? She gained some weight "because she eats all the time, may forget that he ate and eats again" Patient have trouble swallowing? Patient denies   Does the patient cook?  Patient denies   Any kitchen accidents such as leaving the stove on? Patient denies   Any headaches?  Patient denies   Double vision? Patient denies   Any focal numbness or tingling?  Patient denies   Chronic back pain Patient denies   Unilateral weakness?  Patient denies   Any tremors?  Patient denies   Any history of anosmia?  Patient denies   Any incontinence of urine?  Patient denies   Any bowel dysfunction?   Patient denies      Patient lives with: husband at [facility], family considering ALF   "HISTORY OF PRESENT ILLNESS (from 04/15/2020):  This is a pleasant 88 year old right-handed woman with a history of breast cancer presenting for evaluation of dementia. Her 2 sons Melody Wilson and Melody Wilson are present and provided additional information privately as  well. The patient feels her memory is "ok for my age." Her sons started noticing short-term memory changes for the past few years, but more concerning recently. She forgets dates and appointments. Bills were not getting paid, her sons report partly due to being unable to get to places to physically pay them being a challenge. She had not paid the homeowners fee last May. She gets confused when he asks her questions. She gets frustrated and would shut down, not wanting to talk. She continues to drive locally and denies getting lost, sons have not gotten any calls about this. They state she hardly leaves the house. She is not taking any regular  medications. She used to have Tramadol for her back pain but she had started taking it daily so her sons took it out of the house. She takes Aleve prn. She does not cook as much and denies leaving the stove on. She has noticed she misplaces things, she has to double check things more. Her sons report she is writing things down and making lists. She is independent with dressing and bathing, no hygiene concerns, house is taken care of. No paranoia or hallucinations. There was only one incident in the early Spring where she was convinced David's neighbor left a pile of wood in her neighbor's lawn, when he did not. No family history of dementia. No history of concussions or alcohol use.    She has had a daily headache for the past couple of months. She reports an achy pain on the vertex, like a headband, lasting a few hours. She does not take the Aleve daily for the headaches. There is no associated nausea/vomiting, photo/phonophobia, vision changes. She denies any dizziness, diplopia, dysarthria/dysphagia, bowel/bladder dysfunction, anosmia, or tremors. She reports pain going down her left arm and arthritis in both hands. Sleep is good, no wandering behavior. No falls. "    Neurocognitive Feedback Note (Dr. Alphonzo Severance, 05/26/20) :   "..We discussed impression of mild stage dementia. We had a long discussion about necessary supports at this stage of the disease, which in Melody Wilson's case is mostly already being done, such as finance and medications. We also discussed settings that are appropriate for individuals with mild stage AD, including home with some assistance, assisted living, and independent living. Ms. Double son's are concerned that she is quite isolated although she is hesitant to leave the home, and we processed that. Marland Kitchen.There are no safety concerns at present."   PREVIOUS MEDICATIONS:   CURRENT MEDICATIONS:  Outpatient Encounter Medications as of 06/22/2022  Medication Sig   aspirin 81 MG  tablet Take 81 mg by mouth daily.   donepezil (ARICEPT) 10 MG tablet Take 1 tablet daily   gabapentin (NEURONTIN) 100 MG capsule Take 1 capsule (100 mg total) by mouth at bedtime.   memantine (NAMENDA) 10 MG tablet Take half tablet ('5mg'$  at night) for 2 weeks, then increase to 1 tablet (10 mg) nightly   Multiple Vitamins-Minerals (MULTIVITAMIN WITH MINERALS) tablet Take 1 tablet by mouth daily.   naproxen sodium (ANAPROX) 220 MG tablet Take 220 mg by mouth 2 (two) times daily with a meal.   vitamin C (ASCORBIC ACID) 500 MG tablet Take 500 mg by mouth daily.   vitamin E 400 UNIT capsule Take 400 Units by mouth daily.   No facility-administered encounter medications on file as of 06/22/2022.       06/22/2022    3:00 PM 06/21/2021    8:00 AM 05/09/2020  1:00 PM  MMSE - Mini Mental State Exam  Orientation to time '4 3 4  '$ Orientation to Place '5 5 4  '$ Registration '3 3 3  '$ Attention/ Calculation '5 2 5  '$ Recall 0 0 0  Language- name 2 objects '1 2 1  '$ Language- repeat 1 1 0  Language- follow 3 step command '3 2 3  '$ Language- read & follow direction '1 1 1  '$ Write a sentence 0 1 1  Copy design 0 0 0  Total score '23 20 22       '$ No data to display          Objective:     PHYSICAL EXAMINATION:    VITALS:   Vitals:   06/22/22 1532  Pulse: 60  Resp: 18  SpO2: 97%  Weight: 153 lb (69.4 kg)    GEN:  The patient appears stated age and is in NAD. HEENT:  Normocephalic, atraumatic.   Neurological examination:  General: NAD, well-groomed, appears stated age. Orientation: The patient is alert. Oriented to person, place and not to date Cranial nerves: There is good facial symmetry.The speech is fluent and clear. No aphasia or dysarthria. Fund of knowledge is appropriate. Recent and remote memory are impaired. Attention and concentration are reduced.  Able to name objects and repeat phrases.  Hearing is intact to conversational tone.    Sensation: Sensation is intact to light touch  throughout Motor: Strength is at least antigravity x4. Tremors: none  DTR's 2/4 in UE/LE     Movement examination: Tone: There is normal tone in the UE/LE Abnormal movements:  no tremor.  No myoclonus.  No asterixis.   Coordination:  There is no decremation with RAM's. Normal finger to nose  Gait and Station: The patient has no difficulty arising out of a deep-seated chair without the use of the hands. The patient's stride length is good.  Gait is cautious and narrow.    Thank you for allowing [place] the opportunity to participate in the care of this nice patient. Please do not hesitate to contact us for any questions or concerns.   Total time spent on today's visit was 25 minutes dedicated to this patient today, preparing to see patient, examining the patient, ordering tests and/or medications and counseling the patient, documenting clinical information in the EHR or other health record, independently interpreting results and communicating results to the patient/family, discussing treatment and goals, answering patient's questions and coordinating care.  Cc:  Jonathon Jordan, MD  Sharene Butters 06/24/2022 5:55 AM

## 2022-07-04 DIAGNOSIS — F0390 Unspecified dementia without behavioral disturbance: Secondary | ICD-10-CM | POA: Diagnosis not present

## 2022-07-04 DIAGNOSIS — G301 Alzheimer's disease with late onset: Secondary | ICD-10-CM | POA: Diagnosis not present

## 2022-07-04 DIAGNOSIS — E7800 Pure hypercholesterolemia, unspecified: Secondary | ICD-10-CM | POA: Diagnosis not present

## 2022-07-04 DIAGNOSIS — J40 Bronchitis, not specified as acute or chronic: Secondary | ICD-10-CM | POA: Diagnosis not present

## 2022-07-04 DIAGNOSIS — M810 Age-related osteoporosis without current pathological fracture: Secondary | ICD-10-CM | POA: Diagnosis not present

## 2022-07-04 DIAGNOSIS — F3341 Major depressive disorder, recurrent, in partial remission: Secondary | ICD-10-CM | POA: Diagnosis not present

## 2022-07-04 DIAGNOSIS — Z23 Encounter for immunization: Secondary | ICD-10-CM | POA: Diagnosis not present

## 2022-07-04 DIAGNOSIS — F0280 Dementia in other diseases classified elsewhere without behavioral disturbance: Secondary | ICD-10-CM | POA: Diagnosis not present

## 2022-07-04 DIAGNOSIS — Z0000 Encounter for general adult medical examination without abnormal findings: Secondary | ICD-10-CM | POA: Diagnosis not present

## 2022-07-15 ENCOUNTER — Other Ambulatory Visit: Payer: Self-pay | Admitting: Physician Assistant

## 2022-07-16 DIAGNOSIS — M5137 Other intervertebral disc degeneration, lumbosacral region: Secondary | ICD-10-CM | POA: Diagnosis not present

## 2022-07-16 DIAGNOSIS — G301 Alzheimer's disease with late onset: Secondary | ICD-10-CM | POA: Diagnosis not present

## 2022-07-16 DIAGNOSIS — Z853 Personal history of malignant neoplasm of breast: Secondary | ICD-10-CM | POA: Diagnosis not present

## 2022-07-16 DIAGNOSIS — M1710 Unilateral primary osteoarthritis, unspecified knee: Secondary | ICD-10-CM | POA: Diagnosis not present

## 2022-07-16 DIAGNOSIS — E7800 Pure hypercholesterolemia, unspecified: Secondary | ICD-10-CM | POA: Diagnosis not present

## 2022-07-16 DIAGNOSIS — F0283 Dementia in other diseases classified elsewhere, unspecified severity, with mood disturbance: Secondary | ICD-10-CM | POA: Diagnosis not present

## 2022-07-16 DIAGNOSIS — F3341 Major depressive disorder, recurrent, in partial remission: Secondary | ICD-10-CM | POA: Diagnosis not present

## 2022-07-16 DIAGNOSIS — J40 Bronchitis, not specified as acute or chronic: Secondary | ICD-10-CM | POA: Diagnosis not present

## 2022-07-16 DIAGNOSIS — Z9181 History of falling: Secondary | ICD-10-CM | POA: Diagnosis not present

## 2022-07-16 DIAGNOSIS — M810 Age-related osteoporosis without current pathological fracture: Secondary | ICD-10-CM | POA: Diagnosis not present

## 2022-07-20 DIAGNOSIS — F3341 Major depressive disorder, recurrent, in partial remission: Secondary | ICD-10-CM | POA: Diagnosis not present

## 2022-07-20 DIAGNOSIS — M810 Age-related osteoporosis without current pathological fracture: Secondary | ICD-10-CM | POA: Diagnosis not present

## 2022-07-20 DIAGNOSIS — J40 Bronchitis, not specified as acute or chronic: Secondary | ICD-10-CM | POA: Diagnosis not present

## 2022-07-20 DIAGNOSIS — M1710 Unilateral primary osteoarthritis, unspecified knee: Secondary | ICD-10-CM | POA: Diagnosis not present

## 2022-07-20 DIAGNOSIS — F0283 Dementia in other diseases classified elsewhere, unspecified severity, with mood disturbance: Secondary | ICD-10-CM | POA: Diagnosis not present

## 2022-07-20 DIAGNOSIS — G301 Alzheimer's disease with late onset: Secondary | ICD-10-CM | POA: Diagnosis not present

## 2022-07-24 DIAGNOSIS — M810 Age-related osteoporosis without current pathological fracture: Secondary | ICD-10-CM | POA: Diagnosis not present

## 2022-07-24 DIAGNOSIS — G301 Alzheimer's disease with late onset: Secondary | ICD-10-CM | POA: Diagnosis not present

## 2022-07-24 DIAGNOSIS — J40 Bronchitis, not specified as acute or chronic: Secondary | ICD-10-CM | POA: Diagnosis not present

## 2022-07-24 DIAGNOSIS — F3341 Major depressive disorder, recurrent, in partial remission: Secondary | ICD-10-CM | POA: Diagnosis not present

## 2022-07-24 DIAGNOSIS — M1710 Unilateral primary osteoarthritis, unspecified knee: Secondary | ICD-10-CM | POA: Diagnosis not present

## 2022-07-24 DIAGNOSIS — F0283 Dementia in other diseases classified elsewhere, unspecified severity, with mood disturbance: Secondary | ICD-10-CM | POA: Diagnosis not present

## 2022-07-31 DIAGNOSIS — M810 Age-related osteoporosis without current pathological fracture: Secondary | ICD-10-CM | POA: Diagnosis not present

## 2022-07-31 DIAGNOSIS — M1710 Unilateral primary osteoarthritis, unspecified knee: Secondary | ICD-10-CM | POA: Diagnosis not present

## 2022-07-31 DIAGNOSIS — G301 Alzheimer's disease with late onset: Secondary | ICD-10-CM | POA: Diagnosis not present

## 2022-07-31 DIAGNOSIS — F0283 Dementia in other diseases classified elsewhere, unspecified severity, with mood disturbance: Secondary | ICD-10-CM | POA: Diagnosis not present

## 2022-07-31 DIAGNOSIS — J40 Bronchitis, not specified as acute or chronic: Secondary | ICD-10-CM | POA: Diagnosis not present

## 2022-07-31 DIAGNOSIS — F3341 Major depressive disorder, recurrent, in partial remission: Secondary | ICD-10-CM | POA: Diagnosis not present

## 2022-08-07 DIAGNOSIS — F3341 Major depressive disorder, recurrent, in partial remission: Secondary | ICD-10-CM | POA: Diagnosis not present

## 2022-08-07 DIAGNOSIS — J40 Bronchitis, not specified as acute or chronic: Secondary | ICD-10-CM | POA: Diagnosis not present

## 2022-08-07 DIAGNOSIS — F0283 Dementia in other diseases classified elsewhere, unspecified severity, with mood disturbance: Secondary | ICD-10-CM | POA: Diagnosis not present

## 2022-08-07 DIAGNOSIS — G301 Alzheimer's disease with late onset: Secondary | ICD-10-CM | POA: Diagnosis not present

## 2022-08-07 DIAGNOSIS — M1710 Unilateral primary osteoarthritis, unspecified knee: Secondary | ICD-10-CM | POA: Diagnosis not present

## 2022-08-07 DIAGNOSIS — M810 Age-related osteoporosis without current pathological fracture: Secondary | ICD-10-CM | POA: Diagnosis not present

## 2022-08-08 DIAGNOSIS — J40 Bronchitis, not specified as acute or chronic: Secondary | ICD-10-CM | POA: Diagnosis not present

## 2022-08-08 DIAGNOSIS — F0283 Dementia in other diseases classified elsewhere, unspecified severity, with mood disturbance: Secondary | ICD-10-CM | POA: Diagnosis not present

## 2022-08-08 DIAGNOSIS — M1710 Unilateral primary osteoarthritis, unspecified knee: Secondary | ICD-10-CM | POA: Diagnosis not present

## 2022-08-08 DIAGNOSIS — F3341 Major depressive disorder, recurrent, in partial remission: Secondary | ICD-10-CM | POA: Diagnosis not present

## 2022-08-08 DIAGNOSIS — M810 Age-related osteoporosis without current pathological fracture: Secondary | ICD-10-CM | POA: Diagnosis not present

## 2022-08-08 DIAGNOSIS — G301 Alzheimer's disease with late onset: Secondary | ICD-10-CM | POA: Diagnosis not present

## 2022-08-15 DIAGNOSIS — J40 Bronchitis, not specified as acute or chronic: Secondary | ICD-10-CM | POA: Diagnosis not present

## 2022-08-15 DIAGNOSIS — G301 Alzheimer's disease with late onset: Secondary | ICD-10-CM | POA: Diagnosis not present

## 2022-08-15 DIAGNOSIS — M5137 Other intervertebral disc degeneration, lumbosacral region: Secondary | ICD-10-CM | POA: Diagnosis not present

## 2022-08-15 DIAGNOSIS — M1710 Unilateral primary osteoarthritis, unspecified knee: Secondary | ICD-10-CM | POA: Diagnosis not present

## 2022-08-15 DIAGNOSIS — Z853 Personal history of malignant neoplasm of breast: Secondary | ICD-10-CM | POA: Diagnosis not present

## 2022-08-15 DIAGNOSIS — E7800 Pure hypercholesterolemia, unspecified: Secondary | ICD-10-CM | POA: Diagnosis not present

## 2022-08-15 DIAGNOSIS — F3341 Major depressive disorder, recurrent, in partial remission: Secondary | ICD-10-CM | POA: Diagnosis not present

## 2022-08-15 DIAGNOSIS — F0283 Dementia in other diseases classified elsewhere, unspecified severity, with mood disturbance: Secondary | ICD-10-CM | POA: Diagnosis not present

## 2022-08-15 DIAGNOSIS — M810 Age-related osteoporosis without current pathological fracture: Secondary | ICD-10-CM | POA: Diagnosis not present

## 2022-08-15 DIAGNOSIS — Z9181 History of falling: Secondary | ICD-10-CM | POA: Diagnosis not present

## 2022-08-21 DIAGNOSIS — G301 Alzheimer's disease with late onset: Secondary | ICD-10-CM | POA: Diagnosis not present

## 2022-08-21 DIAGNOSIS — J40 Bronchitis, not specified as acute or chronic: Secondary | ICD-10-CM | POA: Diagnosis not present

## 2022-08-21 DIAGNOSIS — M1710 Unilateral primary osteoarthritis, unspecified knee: Secondary | ICD-10-CM | POA: Diagnosis not present

## 2022-08-21 DIAGNOSIS — F3341 Major depressive disorder, recurrent, in partial remission: Secondary | ICD-10-CM | POA: Diagnosis not present

## 2022-08-21 DIAGNOSIS — M810 Age-related osteoporosis without current pathological fracture: Secondary | ICD-10-CM | POA: Diagnosis not present

## 2022-08-21 DIAGNOSIS — F0283 Dementia in other diseases classified elsewhere, unspecified severity, with mood disturbance: Secondary | ICD-10-CM | POA: Diagnosis not present

## 2022-08-30 DIAGNOSIS — M1710 Unilateral primary osteoarthritis, unspecified knee: Secondary | ICD-10-CM | POA: Diagnosis not present

## 2022-08-30 DIAGNOSIS — M810 Age-related osteoporosis without current pathological fracture: Secondary | ICD-10-CM | POA: Diagnosis not present

## 2022-08-30 DIAGNOSIS — F3341 Major depressive disorder, recurrent, in partial remission: Secondary | ICD-10-CM | POA: Diagnosis not present

## 2022-08-30 DIAGNOSIS — G301 Alzheimer's disease with late onset: Secondary | ICD-10-CM | POA: Diagnosis not present

## 2022-08-30 DIAGNOSIS — J40 Bronchitis, not specified as acute or chronic: Secondary | ICD-10-CM | POA: Diagnosis not present

## 2022-08-30 DIAGNOSIS — F0283 Dementia in other diseases classified elsewhere, unspecified severity, with mood disturbance: Secondary | ICD-10-CM | POA: Diagnosis not present

## 2022-09-04 DIAGNOSIS — F3341 Major depressive disorder, recurrent, in partial remission: Secondary | ICD-10-CM | POA: Diagnosis not present

## 2022-09-04 DIAGNOSIS — F0283 Dementia in other diseases classified elsewhere, unspecified severity, with mood disturbance: Secondary | ICD-10-CM | POA: Diagnosis not present

## 2022-09-04 DIAGNOSIS — M1710 Unilateral primary osteoarthritis, unspecified knee: Secondary | ICD-10-CM | POA: Diagnosis not present

## 2022-09-04 DIAGNOSIS — M810 Age-related osteoporosis without current pathological fracture: Secondary | ICD-10-CM | POA: Diagnosis not present

## 2022-09-04 DIAGNOSIS — G301 Alzheimer's disease with late onset: Secondary | ICD-10-CM | POA: Diagnosis not present

## 2022-09-04 DIAGNOSIS — J40 Bronchitis, not specified as acute or chronic: Secondary | ICD-10-CM | POA: Diagnosis not present

## 2022-09-07 DIAGNOSIS — M1710 Unilateral primary osteoarthritis, unspecified knee: Secondary | ICD-10-CM | POA: Diagnosis not present

## 2022-09-07 DIAGNOSIS — J40 Bronchitis, not specified as acute or chronic: Secondary | ICD-10-CM | POA: Diagnosis not present

## 2022-09-07 DIAGNOSIS — G301 Alzheimer's disease with late onset: Secondary | ICD-10-CM | POA: Diagnosis not present

## 2022-09-07 DIAGNOSIS — F0283 Dementia in other diseases classified elsewhere, unspecified severity, with mood disturbance: Secondary | ICD-10-CM | POA: Diagnosis not present

## 2022-09-07 DIAGNOSIS — M810 Age-related osteoporosis without current pathological fracture: Secondary | ICD-10-CM | POA: Diagnosis not present

## 2022-09-07 DIAGNOSIS — F3341 Major depressive disorder, recurrent, in partial remission: Secondary | ICD-10-CM | POA: Diagnosis not present

## 2022-09-11 DIAGNOSIS — M810 Age-related osteoporosis without current pathological fracture: Secondary | ICD-10-CM | POA: Diagnosis not present

## 2022-09-11 DIAGNOSIS — F3341 Major depressive disorder, recurrent, in partial remission: Secondary | ICD-10-CM | POA: Diagnosis not present

## 2022-09-11 DIAGNOSIS — G301 Alzheimer's disease with late onset: Secondary | ICD-10-CM | POA: Diagnosis not present

## 2022-09-11 DIAGNOSIS — M1710 Unilateral primary osteoarthritis, unspecified knee: Secondary | ICD-10-CM | POA: Diagnosis not present

## 2022-09-11 DIAGNOSIS — J40 Bronchitis, not specified as acute or chronic: Secondary | ICD-10-CM | POA: Diagnosis not present

## 2022-09-11 DIAGNOSIS — F0283 Dementia in other diseases classified elsewhere, unspecified severity, with mood disturbance: Secondary | ICD-10-CM | POA: Diagnosis not present

## 2022-09-24 ENCOUNTER — Ambulatory Visit (INDEPENDENT_AMBULATORY_CARE_PROVIDER_SITE_OTHER): Payer: Medicare Other | Admitting: Podiatry

## 2022-09-24 ENCOUNTER — Encounter: Payer: Self-pay | Admitting: Podiatry

## 2022-09-24 DIAGNOSIS — B351 Tinea unguium: Secondary | ICD-10-CM | POA: Diagnosis not present

## 2022-09-24 DIAGNOSIS — M79675 Pain in left toe(s): Secondary | ICD-10-CM

## 2022-09-24 DIAGNOSIS — M79674 Pain in right toe(s): Secondary | ICD-10-CM | POA: Diagnosis not present

## 2022-09-24 NOTE — Progress Notes (Signed)
This patient presents to the office with chief complaint of long thick painful nails.  Patient says the nails are painful walking and wearing shoes.  This patient is unable to self treat.  This patient is unable to trim her nails since she is unable to reach her nails. She presents to the office with her daughter. She presents to the office for preventative foot care services.   General Appearance  Alert, conversant and in no acute stress.  Vascular  Dorsalis pedis and posterior tibial  pulses are weakly  palpable  bilaterally.  Capillary return is within normal limits  bilaterally. Temperature is within normal limits  bilaterally.  Neurologic  Senn-Weinstein monofilament wire test within normal limits  bilaterally. Muscle power within normal limits bilaterally.  Nails Thick disfigured discolored nails with subungual debris  from hallux to fifth toes bilaterally. No evidence of bacterial infection or drainage bilaterally.  Orthopedic  No limitations of motion  feet .  No crepitus or effusions noted.  No bony pathology or digital deformities noted.  Skin  normotropic skin with no porokeratosis noted bilaterally.  No signs of infections or ulcers noted.     Onychomycosis  Nails  B/L.  Pain in right toes  Pain in left toes  Debridement of nails both feet followed trimming the nails with dremel tool.    RTC 3 months.   Gardiner Barefoot DPM

## 2022-11-22 ENCOUNTER — Other Ambulatory Visit: Payer: Self-pay | Admitting: Physician Assistant

## 2022-12-24 ENCOUNTER — Ambulatory Visit (INDEPENDENT_AMBULATORY_CARE_PROVIDER_SITE_OTHER): Payer: Medicare Other | Admitting: Podiatry

## 2022-12-24 ENCOUNTER — Encounter: Payer: Self-pay | Admitting: Podiatry

## 2022-12-24 DIAGNOSIS — M79674 Pain in right toe(s): Secondary | ICD-10-CM

## 2022-12-24 DIAGNOSIS — B351 Tinea unguium: Secondary | ICD-10-CM

## 2022-12-24 DIAGNOSIS — M79675 Pain in left toe(s): Secondary | ICD-10-CM

## 2022-12-24 NOTE — Progress Notes (Signed)
This patient presents to the office with chief complaint of long thick painful nails.  Patient says the nails are painful walking and wearing shoes.  This patient is unable to self treat.  This patient is unable to trim her nails since she is unable to reach her nails. She presents to the office with her daughter. She presents to the office for preventative foot care services.   General Appearance  Alert, conversant and in no acute stress.  Vascular  Dorsalis pedis and posterior tibial  pulses are weakly  palpable  bilaterally.  Capillary return is within normal limits  bilaterally. Temperature is within normal limits  bilaterally.  Neurologic  Senn-Weinstein monofilament wire test within normal limits  bilaterally. Muscle power within normal limits bilaterally.  Nails Thick disfigured discolored nails with subungual debris  from hallux to fifth toes bilaterally. No evidence of bacterial infection or drainage bilaterally.  Orthopedic  No limitations of motion  feet .  No crepitus or effusions noted.  No bony pathology or digital deformities noted.  Skin  normotropic skin with no porokeratosis noted bilaterally.  No signs of infections or ulcers noted.     Onychomycosis  Nails  B/L.  Pain in right toes  Pain in left toes  Debridement of nails both feet followed trimming the nails with dremel tool.    RTC 3 months.   Bunnie Rehberg DPM   

## 2023-01-08 ENCOUNTER — Encounter: Payer: Self-pay | Admitting: Physician Assistant

## 2023-01-08 ENCOUNTER — Ambulatory Visit (INDEPENDENT_AMBULATORY_CARE_PROVIDER_SITE_OTHER): Payer: Medicare Other | Admitting: Physician Assistant

## 2023-01-08 VITALS — BP 126/68 | HR 98 | Resp 20 | Wt 151.0 lb

## 2023-01-08 DIAGNOSIS — F0280 Dementia in other diseases classified elsewhere without behavioral disturbance: Secondary | ICD-10-CM | POA: Diagnosis not present

## 2023-01-08 DIAGNOSIS — G301 Alzheimer's disease with late onset: Secondary | ICD-10-CM | POA: Diagnosis not present

## 2023-01-08 NOTE — Progress Notes (Signed)
Assessment/Plan:   Dementia likely due to Alzheimer's disease, late onset Melody Wilson is a very pleasant 89 y.o. RH female with a history of dementia likely due to Alzheimer's disease, late onset seen today in follow up for memory loss. Patient is currently on memantine 10 mg twice daily and donepezil 10 mg daily, tolerating well.  Her memory is stable.  MMSE today is 20/30 with a slight decrease in orientation to time.  She does require more help with her ADLs, especially with medications due to noncompliance with them.  She continues to live in [facility], at this moment there are no plans for a higher level of care such as ALF or memory care.    Recommendations   Follow up in 6 months. Continue donepezil 10 mg daily and memantine 10 mg twice daily, side effects discussed. Recommend good control of her cardiovascular risk factors Continue to control mood as per PCP    Subjective:    This patient is accompanied in the office by her son who supplements the history.  Previous records as well as any outside records available were reviewed prior to todays visit. Patient was last seen on 06/22/2022 with MMSE of 23/30.   Any changes in memory since last visit?  Despite decreased insight as before, overall, she is stable from the cognitive standpoint.  She remains fairly active at home, she does not like to do any brain games such as crossword's or word finding.  She is happy at Melody Wilson. repeats oneself?  Endorsed Disoriented when walking into a room?  Patient denies Leaving objects in unusual places? denies   Wandering behavior?  denies   Any personality changes since last visit?  denies   Any worsening depression?:  denies   Hallucinations or paranoia?  denies   Seizures?    denies    Any sleep changes?  Denies vivid dreams, REM behavior or sleepwalking   Sleep apnea?   denies   Any hygiene concerns?    denies   Independent of bathing and dressing?  Endorsed  Does the  patient needs help with medications?  Son monitors the medication, but she may refuse at times.  I  Who is in charge of the finances?  Son is in charge Any changes in appetite?  Patient is eating very well, her weight is stable.  Is unclear if she forgets that she ate and eats again. Patient have trouble swallowing?  denies   Does the patient cook?  No.  Any kitchen accidents such as leaving the stove on? Patient denies   Any headaches?   denies   Chronic back pain  denies   Ambulates with difficulty?     denies   Recent falls or head injuries? denies     Unilateral weakness, numbness or tingling?    denies   Any tremors?  denies   Any anosmia?  Patient denies   Any incontinence of urine?  denies   Any bowel dysfunction?     denies      Patient lives in [facility] independent [facility]. Does the patient drive?  She no longer drives.  "HISTORY OF PRESENT ILLNESS (from 04/15/2020):  This is a pleasant 88 year old right-handed woman with a history of breast cancer presenting for evaluation of dementia. Her 2 sons Melody Hua and Melody Wilson are present and provided additional information privately as well. The patient feels her memory is "ok for my age." Her sons started noticing short-term memory changes for the  past few years, but more concerning recently. She forgets dates and appointments. Bills were not getting paid, her sons report partly due to being unable to get to places to physically pay them being a challenge. She had not paid the homeowners fee last May. She gets confused when he asks her questions. She gets frustrated and would shut down, not wanting to talk. She continues to drive locally and denies getting lost, sons have not gotten any calls about this. They state she hardly leaves the house. She is not taking any regular medications. She used to have Tramadol for her back pain but she had started taking it daily so her sons took it out of the house. She takes Aleve prn. She does not cook as much and  denies leaving the stove on. She has noticed she misplaces things, she has to double check things more. Her sons report she is writing things down and making lists. She is independent with dressing and bathing, no hygiene concerns, house is taken care of. No paranoia or hallucinations. There was only one incident in the early Spring where she was convinced Wilson's neighbor left a pile of wood in her neighbor's lawn, when he did not. No family history of dementia. No history of concussions or alcohol use.    She has had a daily headache for the past couple of months. She reports an achy pain on the vertex, like a headband, lasting a few hours. She does not take the Aleve daily for the headaches. There is no associated nausea/vomiting, photo/phonophobia, vision changes. She denies any dizziness, diplopia, dysarthria/dysphagia, bowel/bladder dysfunction, anosmia, or tremors. She reports pain going down her left arm and arthritis in both hands. Sleep is good, no wandering behavior. No falls. "    Neurocognitive Feedback Note (Melody Wilson, 05/26/20) :   "..We discussed impression of mild stage dementia. We had a long discussion about necessary supports at this stage of the disease, which in Melody Wilson's case is mostly already being done, such as finance and medications. We also discussed settings that are appropriate for individuals with mild stage AD, including home with some assistance, assisted living, and independent living. Melody Wilson son's are concerned that she is quite isolated although she is hesitant to leave the home, and we processed that. Marland Kitchen.There are no safety concerns at present." PREVIOUS MEDICATIONS:   CURRENT MEDICATIONS:  Outpatient Encounter Medications as of 01/08/2023  Medication Sig   aspirin 81 MG tablet Take 81 mg by mouth daily.   donepezil (ARICEPT) 10 MG tablet Take 1 tablet by mouth once daily   gabapentin (NEURONTIN) 100 MG capsule Take 1 capsule (100 mg total) by mouth at  bedtime.   memantine (NAMENDA) 10 MG tablet TAKE 1/2 (ONE-HALF) TABLET BY MOUTH AT NIGHT FOR 14 DAYS ,  THEN  INCREASE  TO  1  TABLET  NIGHTLY.   Multiple Vitamins-Minerals (MULTIVITAMIN WITH MINERALS) tablet Take 1 tablet by mouth daily.   naproxen sodium (ANAPROX) 220 MG tablet Take 220 mg by mouth 2 (two) times daily with a meal.   vitamin C (ASCORBIC ACID) 500 MG tablet Take 500 mg by mouth daily.   vitamin E 400 UNIT capsule Take 400 Units by mouth daily.   No facility-administered encounter medications on file as of 01/08/2023.       01/08/2023    5:00 PM 06/22/2022    3:00 PM 06/21/2021    8:00 AM  MMSE - Mini Mental State Exam  Orientation to  time 1 4 3   Orientation to Place 5 5 5   Registration 3 3 3   Attention/ Calculation 4 5 2   Recall 0 0 0  Language- name 2 objects 2 1 2   Language- repeat 1 1 1   Language- follow 3 step command 3 3 2   Language- read & follow direction 1 1 1   Write a sentence 0 0 1  Copy design 0 0 0  Total score 20 23 20        No data to display          Objective:     PHYSICAL EXAMINATION:    VITALS:   Vitals:   01/08/23 1526  BP: 126/68  Pulse: 98  Resp: 20  SpO2: 98%  Weight: 151 lb (68.5 kg)    GEN:  The patient appears stated age and is in NAD. HEENT:  Normocephalic, atraumatic.   Neurological examination:  General: NAD, well-groomed, appears stated age. Orientation: The patient is alert. Oriented to person, place and not to date Cranial nerves: There is good facial symmetry.The speech is fluent and clear. No aphasia or dysarthria. Fund of knowledge is appropriate. Recent and remote memory are impaired. Attention and concentration are reduced.  Able to name objects and repeat phrases.  Hearing is intact to conversational tone. Sensation: Sensation is intact to light touch throughout Motor: Strength is at least antigravity x4. DTR's 2/4 in UE/LE     Movement examination: Tone: There is normal tone in the UE/LE Abnormal  movements:  no tremor.  No myoclonus.  No asterixis.   Coordination:  There is no decremation with RAM's but she has difficulty following the instructions. Normal finger to nose  Gait and Station: The patient has no difficulty arising out of a deep-seated chair without the use of the hands. The patient's stride length is good.  Gait is cautious and narrow.    Thank you for allowing [place] the opportunity to participate in the care of this nice patient. Please do not hesitate to contact us for any questions or concerns.   Total time spent on today's visit was 15 minutes dedicated to this patient today, preparing to see patient, examining the patient, ordering tests and/or medications and counseling the patient, documenting clinical information in the EHR or other health record, independently interpreting results and communicating results to the patient/family, discussing treatment and goals, answering patient's questions and coordinating care.  Cc:  Mila Palmer, MD  Marlowe Kays 01/08/2023 5:52 PM

## 2023-01-08 NOTE — Patient Instructions (Signed)
It was a pleasure to see you today at our office.   Recommendations:  Follow up in 6 months. Continue donepezil 10 mg daily. Side effects were discussed  Continue Memantine 10 mg twice daily. Side effects were discussed    Whom to call:  Memory  decline, memory medications: Call our office [phone]   For psychiatric meds, mood meds: Please have your primary care physician manage these medications.       For assessment of decision of mental capacity and competency:  Call Dr. Erick Blinks, geriatric psychiatrist at [phone]  For guidance in geriatric dementia issues please call [facility] [phone]  For guidance regarding [facility] and if placement were needed at the facility, contact Sidney Ace, Social Worker tel: [phone]  If you have any severe symptoms of a stroke, or other severe issues such as confusion,severe chills or fever, etc call 911 or go to the ER as you may need to be evaluated further   Feel free to visit Facebook page " [email or web address]" for tips of how to care for people with memory problems.     RECOMMENDATIONS FOR ALL PATIENTS WITH MEMORY PROBLEMS: 1. Continue to exercise (Recommend 30 minutes of walking everyday, or 3 hours every week) 2. Increase social interactions - continue going to [place] and enjoy social gatherings with friends and family 3. Eat healthy, avoid fried foods and eat more fruits and vegetables 4. Maintain adequate blood pressure, blood sugar, and blood cholesterol level. Reducing the risk of stroke and cardiovascular disease also helps promoting better memory. 5. Avoid stressful situations. Live a simple life and avoid aggravations. Organize your time and prepare for the next day in anticipation. 6. Sleep well, avoid any interruptions of sleep and avoid any distractions in the bedroom that may interfere with adequate sleep quality 7. Avoid sugar, avoid sweets as there is a strong link between  excessive sugar intake, diabetes, and cognitive impairment We discussed the Mediterranean diet, which has been shown to help patients reduce the risk of progressive memory disorders and reduces cardiovascular risk. This includes eating fish, eat fruits and green leafy vegetables, nuts like almonds and hazelnuts, walnuts, and also use olive oil. Avoid fast foods and fried foods as much as possible. Avoid sweets and sugar as sugar use has been linked to worsening of memory function.  There is always a concern of gradual progression of memory problems. If this is the case, then we may need to adjust level of care according to patient needs. Support, both to the patient and caregiver, should then be put into place.    FALL PRECAUTIONS: Be cautious when walking. Scan the area for obstacles that may increase the risk of trips and falls. When getting up in the mornings, sit up at the edge of the bed for a few minutes before getting out of bed. Consider elevating the bed at the head end to avoid drop of blood pressure when getting up. Walk always in a well-lit room (use night lights in the walls). Avoid area rugs or power cords from appliances in the middle of the walkways. Use a walker or a cane if necessary and consider physical therapy for balance exercise. Get your eyesight checked regularly.  FINANCIAL OVERSIGHT: Supervision, especially oversight when making financial decisions or transactions is also recommended.  HOME SAFETY: Consider the safety of the kitchen when operating appliances like stoves, microwave oven, and blender. Consider having supervision and share cooking responsibilities until no longer able to participate  in those. Accidents with firearms and other hazards in the house should be identified and addressed as well.   ABILITY TO BE LEFT ALONE: If patient is unable to contact 911 operator, consider using [employer], or when the need is there, arrange for someone to stay with patients. Smoking  is a fire hazard, consider supervision or cessation. Risk of wandering should be assessed by caregiver and if detected at any point, supervision and safe proof recommendations should be instituted.  MEDICATION SUPERVISION: Inability to self-administer medication needs to be constantly addressed. Implement a mechanism to ensure safe administration of the medications.   DRIVING: Regarding driving, in patients with progressive memory problems, driving will be impaired. We advise to have someone else do the driving if trouble finding directions or if minor accidents are reported. Independent driving assessment is available to determine safety of driving.   If you are interested in the driving assessment, you can contact the following:  [employer] in [place] [phone]  [facility] [phone]  [facility] [phone]  [facility] [phone] or [phone]

## 2023-03-26 ENCOUNTER — Encounter: Payer: Self-pay | Admitting: Podiatry

## 2023-03-26 ENCOUNTER — Ambulatory Visit (INDEPENDENT_AMBULATORY_CARE_PROVIDER_SITE_OTHER): Payer: Medicare Other | Admitting: Podiatry

## 2023-03-26 DIAGNOSIS — B351 Tinea unguium: Secondary | ICD-10-CM

## 2023-03-26 DIAGNOSIS — M79674 Pain in right toe(s): Secondary | ICD-10-CM | POA: Diagnosis not present

## 2023-03-26 DIAGNOSIS — M79675 Pain in left toe(s): Secondary | ICD-10-CM

## 2023-03-26 NOTE — Progress Notes (Signed)
This patient presents to the office with chief complaint of long thick painful nails.  Patient says the nails are painful walking and wearing shoes.  This patient is unable to self treat.  This patient is unable to trim her nails since she is unable to reach her nails. She presents to the office with her daughter. She presents to the office for preventative foot care services.   General Appearance  Alert, conversant and in no acute stress.  Vascular  Dorsalis pedis and posterior tibial  pulses are weakly  palpable  bilaterally.  Capillary return is within normal limits  bilaterally. Temperature is within normal limits  bilaterally.  Neurologic  Senn-Weinstein monofilament wire test within normal limits  bilaterally. Muscle power within normal limits bilaterally.  Nails Thick disfigured discolored nails with subungual debris  from hallux to fifth toes bilaterally. No evidence of bacterial infection or drainage bilaterally.  Orthopedic  No limitations of motion  feet .  No crepitus or effusions noted.  No bony pathology or digital deformities noted.  Skin  normotropic skin with no porokeratosis noted bilaterally.  No signs of infections or ulcers noted.     Onychomycosis  Nails  B/L.  Pain in right toes  Pain in left toes  Debridement of nails both feet followed trimming the nails with dremel tool.    RTC 3 months.   Gardiner Barefoot DPM

## 2023-04-01 DIAGNOSIS — G44209 Tension-type headache, unspecified, not intractable: Secondary | ICD-10-CM | POA: Diagnosis not present

## 2023-04-01 DIAGNOSIS — E7800 Pure hypercholesterolemia, unspecified: Secondary | ICD-10-CM | POA: Diagnosis not present

## 2023-04-01 DIAGNOSIS — E538 Deficiency of other specified B group vitamins: Secondary | ICD-10-CM | POA: Diagnosis not present

## 2023-04-01 DIAGNOSIS — Z853 Personal history of malignant neoplasm of breast: Secondary | ICD-10-CM | POA: Diagnosis not present

## 2023-04-01 DIAGNOSIS — Z9011 Acquired absence of right breast and nipple: Secondary | ICD-10-CM | POA: Diagnosis not present

## 2023-04-01 DIAGNOSIS — F0283 Dementia in other diseases classified elsewhere, unspecified severity, with mood disturbance: Secondary | ICD-10-CM | POA: Diagnosis not present

## 2023-04-01 DIAGNOSIS — Z9181 History of falling: Secondary | ICD-10-CM | POA: Diagnosis not present

## 2023-04-01 DIAGNOSIS — G301 Alzheimer's disease with late onset: Secondary | ICD-10-CM | POA: Diagnosis not present

## 2023-04-01 DIAGNOSIS — F3341 Major depressive disorder, recurrent, in partial remission: Secondary | ICD-10-CM | POA: Diagnosis not present

## 2023-04-01 DIAGNOSIS — M810 Age-related osteoporosis without current pathological fracture: Secondary | ICD-10-CM | POA: Diagnosis not present

## 2023-04-03 DIAGNOSIS — F0283 Dementia in other diseases classified elsewhere, unspecified severity, with mood disturbance: Secondary | ICD-10-CM | POA: Diagnosis not present

## 2023-04-03 DIAGNOSIS — M810 Age-related osteoporosis without current pathological fracture: Secondary | ICD-10-CM | POA: Diagnosis not present

## 2023-04-03 DIAGNOSIS — G301 Alzheimer's disease with late onset: Secondary | ICD-10-CM | POA: Diagnosis not present

## 2023-04-03 DIAGNOSIS — G44209 Tension-type headache, unspecified, not intractable: Secondary | ICD-10-CM | POA: Diagnosis not present

## 2023-04-03 DIAGNOSIS — F3341 Major depressive disorder, recurrent, in partial remission: Secondary | ICD-10-CM | POA: Diagnosis not present

## 2023-04-03 DIAGNOSIS — E538 Deficiency of other specified B group vitamins: Secondary | ICD-10-CM | POA: Diagnosis not present

## 2023-04-10 DIAGNOSIS — F0390 Unspecified dementia without behavioral disturbance: Secondary | ICD-10-CM | POA: Diagnosis not present

## 2023-04-11 DIAGNOSIS — E538 Deficiency of other specified B group vitamins: Secondary | ICD-10-CM | POA: Diagnosis not present

## 2023-04-11 DIAGNOSIS — F0283 Dementia in other diseases classified elsewhere, unspecified severity, with mood disturbance: Secondary | ICD-10-CM | POA: Diagnosis not present

## 2023-04-11 DIAGNOSIS — M810 Age-related osteoporosis without current pathological fracture: Secondary | ICD-10-CM | POA: Diagnosis not present

## 2023-04-11 DIAGNOSIS — G44209 Tension-type headache, unspecified, not intractable: Secondary | ICD-10-CM | POA: Diagnosis not present

## 2023-04-11 DIAGNOSIS — G301 Alzheimer's disease with late onset: Secondary | ICD-10-CM | POA: Diagnosis not present

## 2023-04-11 DIAGNOSIS — F3341 Major depressive disorder, recurrent, in partial remission: Secondary | ICD-10-CM | POA: Diagnosis not present

## 2023-04-12 DIAGNOSIS — G44209 Tension-type headache, unspecified, not intractable: Secondary | ICD-10-CM | POA: Diagnosis not present

## 2023-04-12 DIAGNOSIS — F3341 Major depressive disorder, recurrent, in partial remission: Secondary | ICD-10-CM | POA: Diagnosis not present

## 2023-04-12 DIAGNOSIS — M810 Age-related osteoporosis without current pathological fracture: Secondary | ICD-10-CM | POA: Diagnosis not present

## 2023-04-12 DIAGNOSIS — F0283 Dementia in other diseases classified elsewhere, unspecified severity, with mood disturbance: Secondary | ICD-10-CM | POA: Diagnosis not present

## 2023-04-12 DIAGNOSIS — E538 Deficiency of other specified B group vitamins: Secondary | ICD-10-CM | POA: Diagnosis not present

## 2023-04-12 DIAGNOSIS — G301 Alzheimer's disease with late onset: Secondary | ICD-10-CM | POA: Diagnosis not present

## 2023-04-16 DIAGNOSIS — G301 Alzheimer's disease with late onset: Secondary | ICD-10-CM | POA: Diagnosis not present

## 2023-04-16 DIAGNOSIS — E538 Deficiency of other specified B group vitamins: Secondary | ICD-10-CM | POA: Diagnosis not present

## 2023-04-16 DIAGNOSIS — M810 Age-related osteoporosis without current pathological fracture: Secondary | ICD-10-CM | POA: Diagnosis not present

## 2023-04-16 DIAGNOSIS — F0283 Dementia in other diseases classified elsewhere, unspecified severity, with mood disturbance: Secondary | ICD-10-CM | POA: Diagnosis not present

## 2023-04-16 DIAGNOSIS — G44209 Tension-type headache, unspecified, not intractable: Secondary | ICD-10-CM | POA: Diagnosis not present

## 2023-04-16 DIAGNOSIS — F3341 Major depressive disorder, recurrent, in partial remission: Secondary | ICD-10-CM | POA: Diagnosis not present

## 2023-04-19 DIAGNOSIS — F3341 Major depressive disorder, recurrent, in partial remission: Secondary | ICD-10-CM | POA: Diagnosis not present

## 2023-04-19 DIAGNOSIS — G301 Alzheimer's disease with late onset: Secondary | ICD-10-CM | POA: Diagnosis not present

## 2023-04-19 DIAGNOSIS — G44209 Tension-type headache, unspecified, not intractable: Secondary | ICD-10-CM | POA: Diagnosis not present

## 2023-04-19 DIAGNOSIS — F0283 Dementia in other diseases classified elsewhere, unspecified severity, with mood disturbance: Secondary | ICD-10-CM | POA: Diagnosis not present

## 2023-04-19 DIAGNOSIS — E538 Deficiency of other specified B group vitamins: Secondary | ICD-10-CM | POA: Diagnosis not present

## 2023-04-19 DIAGNOSIS — M810 Age-related osteoporosis without current pathological fracture: Secondary | ICD-10-CM | POA: Diagnosis not present

## 2023-04-24 DIAGNOSIS — F3341 Major depressive disorder, recurrent, in partial remission: Secondary | ICD-10-CM | POA: Diagnosis not present

## 2023-04-24 DIAGNOSIS — E538 Deficiency of other specified B group vitamins: Secondary | ICD-10-CM | POA: Diagnosis not present

## 2023-04-24 DIAGNOSIS — M810 Age-related osteoporosis without current pathological fracture: Secondary | ICD-10-CM | POA: Diagnosis not present

## 2023-04-24 DIAGNOSIS — G44209 Tension-type headache, unspecified, not intractable: Secondary | ICD-10-CM | POA: Diagnosis not present

## 2023-04-24 DIAGNOSIS — G301 Alzheimer's disease with late onset: Secondary | ICD-10-CM | POA: Diagnosis not present

## 2023-04-24 DIAGNOSIS — F0283 Dementia in other diseases classified elsewhere, unspecified severity, with mood disturbance: Secondary | ICD-10-CM | POA: Diagnosis not present

## 2023-04-26 DIAGNOSIS — M810 Age-related osteoporosis without current pathological fracture: Secondary | ICD-10-CM | POA: Diagnosis not present

## 2023-04-26 DIAGNOSIS — G301 Alzheimer's disease with late onset: Secondary | ICD-10-CM | POA: Diagnosis not present

## 2023-04-26 DIAGNOSIS — F3341 Major depressive disorder, recurrent, in partial remission: Secondary | ICD-10-CM | POA: Diagnosis not present

## 2023-04-26 DIAGNOSIS — F0283 Dementia in other diseases classified elsewhere, unspecified severity, with mood disturbance: Secondary | ICD-10-CM | POA: Diagnosis not present

## 2023-04-26 DIAGNOSIS — G44209 Tension-type headache, unspecified, not intractable: Secondary | ICD-10-CM | POA: Diagnosis not present

## 2023-04-26 DIAGNOSIS — E538 Deficiency of other specified B group vitamins: Secondary | ICD-10-CM | POA: Diagnosis not present

## 2023-05-01 DIAGNOSIS — G44209 Tension-type headache, unspecified, not intractable: Secondary | ICD-10-CM | POA: Diagnosis not present

## 2023-05-01 DIAGNOSIS — G301 Alzheimer's disease with late onset: Secondary | ICD-10-CM | POA: Diagnosis not present

## 2023-05-01 DIAGNOSIS — Z853 Personal history of malignant neoplasm of breast: Secondary | ICD-10-CM | POA: Diagnosis not present

## 2023-05-01 DIAGNOSIS — E538 Deficiency of other specified B group vitamins: Secondary | ICD-10-CM | POA: Diagnosis not present

## 2023-05-01 DIAGNOSIS — Z9181 History of falling: Secondary | ICD-10-CM | POA: Diagnosis not present

## 2023-05-01 DIAGNOSIS — E7800 Pure hypercholesterolemia, unspecified: Secondary | ICD-10-CM | POA: Diagnosis not present

## 2023-05-01 DIAGNOSIS — Z9011 Acquired absence of right breast and nipple: Secondary | ICD-10-CM | POA: Diagnosis not present

## 2023-05-01 DIAGNOSIS — F0283 Dementia in other diseases classified elsewhere, unspecified severity, with mood disturbance: Secondary | ICD-10-CM | POA: Diagnosis not present

## 2023-05-01 DIAGNOSIS — F3341 Major depressive disorder, recurrent, in partial remission: Secondary | ICD-10-CM | POA: Diagnosis not present

## 2023-05-01 DIAGNOSIS — M810 Age-related osteoporosis without current pathological fracture: Secondary | ICD-10-CM | POA: Diagnosis not present

## 2023-05-03 DIAGNOSIS — M810 Age-related osteoporosis without current pathological fracture: Secondary | ICD-10-CM | POA: Diagnosis not present

## 2023-05-03 DIAGNOSIS — G301 Alzheimer's disease with late onset: Secondary | ICD-10-CM | POA: Diagnosis not present

## 2023-05-03 DIAGNOSIS — F3341 Major depressive disorder, recurrent, in partial remission: Secondary | ICD-10-CM | POA: Diagnosis not present

## 2023-05-03 DIAGNOSIS — G44209 Tension-type headache, unspecified, not intractable: Secondary | ICD-10-CM | POA: Diagnosis not present

## 2023-05-03 DIAGNOSIS — F0283 Dementia in other diseases classified elsewhere, unspecified severity, with mood disturbance: Secondary | ICD-10-CM | POA: Diagnosis not present

## 2023-05-03 DIAGNOSIS — E538 Deficiency of other specified B group vitamins: Secondary | ICD-10-CM | POA: Diagnosis not present

## 2023-05-06 DIAGNOSIS — M810 Age-related osteoporosis without current pathological fracture: Secondary | ICD-10-CM | POA: Diagnosis not present

## 2023-05-06 DIAGNOSIS — E538 Deficiency of other specified B group vitamins: Secondary | ICD-10-CM | POA: Diagnosis not present

## 2023-05-06 DIAGNOSIS — G44209 Tension-type headache, unspecified, not intractable: Secondary | ICD-10-CM | POA: Diagnosis not present

## 2023-05-06 DIAGNOSIS — G301 Alzheimer's disease with late onset: Secondary | ICD-10-CM | POA: Diagnosis not present

## 2023-05-06 DIAGNOSIS — F3341 Major depressive disorder, recurrent, in partial remission: Secondary | ICD-10-CM | POA: Diagnosis not present

## 2023-05-06 DIAGNOSIS — F0283 Dementia in other diseases classified elsewhere, unspecified severity, with mood disturbance: Secondary | ICD-10-CM | POA: Diagnosis not present

## 2023-05-13 DIAGNOSIS — F0283 Dementia in other diseases classified elsewhere, unspecified severity, with mood disturbance: Secondary | ICD-10-CM | POA: Diagnosis not present

## 2023-05-13 DIAGNOSIS — M810 Age-related osteoporosis without current pathological fracture: Secondary | ICD-10-CM | POA: Diagnosis not present

## 2023-05-13 DIAGNOSIS — G301 Alzheimer's disease with late onset: Secondary | ICD-10-CM | POA: Diagnosis not present

## 2023-05-13 DIAGNOSIS — G44209 Tension-type headache, unspecified, not intractable: Secondary | ICD-10-CM | POA: Diagnosis not present

## 2023-05-13 DIAGNOSIS — E538 Deficiency of other specified B group vitamins: Secondary | ICD-10-CM | POA: Diagnosis not present

## 2023-05-13 DIAGNOSIS — F3341 Major depressive disorder, recurrent, in partial remission: Secondary | ICD-10-CM | POA: Diagnosis not present

## 2023-05-14 DIAGNOSIS — Z23 Encounter for immunization: Secondary | ICD-10-CM | POA: Diagnosis not present

## 2023-05-20 DIAGNOSIS — M810 Age-related osteoporosis without current pathological fracture: Secondary | ICD-10-CM | POA: Diagnosis not present

## 2023-05-20 DIAGNOSIS — F3341 Major depressive disorder, recurrent, in partial remission: Secondary | ICD-10-CM | POA: Diagnosis not present

## 2023-05-20 DIAGNOSIS — G301 Alzheimer's disease with late onset: Secondary | ICD-10-CM | POA: Diagnosis not present

## 2023-05-20 DIAGNOSIS — F0283 Dementia in other diseases classified elsewhere, unspecified severity, with mood disturbance: Secondary | ICD-10-CM | POA: Diagnosis not present

## 2023-05-20 DIAGNOSIS — G44209 Tension-type headache, unspecified, not intractable: Secondary | ICD-10-CM | POA: Diagnosis not present

## 2023-05-20 DIAGNOSIS — E538 Deficiency of other specified B group vitamins: Secondary | ICD-10-CM | POA: Diagnosis not present

## 2023-05-27 DIAGNOSIS — E538 Deficiency of other specified B group vitamins: Secondary | ICD-10-CM | POA: Diagnosis not present

## 2023-05-27 DIAGNOSIS — G44209 Tension-type headache, unspecified, not intractable: Secondary | ICD-10-CM | POA: Diagnosis not present

## 2023-05-27 DIAGNOSIS — G301 Alzheimer's disease with late onset: Secondary | ICD-10-CM | POA: Diagnosis not present

## 2023-05-27 DIAGNOSIS — M810 Age-related osteoporosis without current pathological fracture: Secondary | ICD-10-CM | POA: Diagnosis not present

## 2023-05-27 DIAGNOSIS — F3341 Major depressive disorder, recurrent, in partial remission: Secondary | ICD-10-CM | POA: Diagnosis not present

## 2023-05-27 DIAGNOSIS — F0283 Dementia in other diseases classified elsewhere, unspecified severity, with mood disturbance: Secondary | ICD-10-CM | POA: Diagnosis not present

## 2023-05-31 DIAGNOSIS — Z9011 Acquired absence of right breast and nipple: Secondary | ICD-10-CM | POA: Diagnosis not present

## 2023-05-31 DIAGNOSIS — E538 Deficiency of other specified B group vitamins: Secondary | ICD-10-CM | POA: Diagnosis not present

## 2023-05-31 DIAGNOSIS — E7800 Pure hypercholesterolemia, unspecified: Secondary | ICD-10-CM | POA: Diagnosis not present

## 2023-05-31 DIAGNOSIS — R5382 Chronic fatigue, unspecified: Secondary | ICD-10-CM | POA: Diagnosis not present

## 2023-05-31 DIAGNOSIS — J309 Allergic rhinitis, unspecified: Secondary | ICD-10-CM | POA: Diagnosis not present

## 2023-05-31 DIAGNOSIS — M549 Dorsalgia, unspecified: Secondary | ICD-10-CM | POA: Diagnosis not present

## 2023-05-31 DIAGNOSIS — F3341 Major depressive disorder, recurrent, in partial remission: Secondary | ICD-10-CM | POA: Diagnosis not present

## 2023-05-31 DIAGNOSIS — M1710 Unilateral primary osteoarthritis, unspecified knee: Secondary | ICD-10-CM | POA: Diagnosis not present

## 2023-05-31 DIAGNOSIS — F0283 Dementia in other diseases classified elsewhere, unspecified severity, with mood disturbance: Secondary | ICD-10-CM | POA: Diagnosis not present

## 2023-05-31 DIAGNOSIS — M5137 Other intervertebral disc degeneration, lumbosacral region with discogenic back pain only: Secondary | ICD-10-CM | POA: Diagnosis not present

## 2023-05-31 DIAGNOSIS — Z853 Personal history of malignant neoplasm of breast: Secondary | ICD-10-CM | POA: Diagnosis not present

## 2023-05-31 DIAGNOSIS — G301 Alzheimer's disease with late onset: Secondary | ICD-10-CM | POA: Diagnosis not present

## 2023-05-31 DIAGNOSIS — G44209 Tension-type headache, unspecified, not intractable: Secondary | ICD-10-CM | POA: Diagnosis not present

## 2023-05-31 DIAGNOSIS — M810 Age-related osteoporosis without current pathological fracture: Secondary | ICD-10-CM | POA: Diagnosis not present

## 2023-05-31 DIAGNOSIS — Z9181 History of falling: Secondary | ICD-10-CM | POA: Diagnosis not present

## 2023-06-04 DIAGNOSIS — G44209 Tension-type headache, unspecified, not intractable: Secondary | ICD-10-CM | POA: Diagnosis not present

## 2023-06-04 DIAGNOSIS — M1710 Unilateral primary osteoarthritis, unspecified knee: Secondary | ICD-10-CM | POA: Diagnosis not present

## 2023-06-04 DIAGNOSIS — F0283 Dementia in other diseases classified elsewhere, unspecified severity, with mood disturbance: Secondary | ICD-10-CM | POA: Diagnosis not present

## 2023-06-04 DIAGNOSIS — G301 Alzheimer's disease with late onset: Secondary | ICD-10-CM | POA: Diagnosis not present

## 2023-06-04 DIAGNOSIS — F3341 Major depressive disorder, recurrent, in partial remission: Secondary | ICD-10-CM | POA: Diagnosis not present

## 2023-06-04 DIAGNOSIS — M5137 Other intervertebral disc degeneration, lumbosacral region with discogenic back pain only: Secondary | ICD-10-CM | POA: Diagnosis not present

## 2023-06-06 DIAGNOSIS — G301 Alzheimer's disease with late onset: Secondary | ICD-10-CM | POA: Diagnosis not present

## 2023-06-06 DIAGNOSIS — M5137 Other intervertebral disc degeneration, lumbosacral region with discogenic back pain only: Secondary | ICD-10-CM | POA: Diagnosis not present

## 2023-06-06 DIAGNOSIS — G44209 Tension-type headache, unspecified, not intractable: Secondary | ICD-10-CM | POA: Diagnosis not present

## 2023-06-06 DIAGNOSIS — M1710 Unilateral primary osteoarthritis, unspecified knee: Secondary | ICD-10-CM | POA: Diagnosis not present

## 2023-06-06 DIAGNOSIS — F0283 Dementia in other diseases classified elsewhere, unspecified severity, with mood disturbance: Secondary | ICD-10-CM | POA: Diagnosis not present

## 2023-06-06 DIAGNOSIS — F3341 Major depressive disorder, recurrent, in partial remission: Secondary | ICD-10-CM | POA: Diagnosis not present

## 2023-06-10 DIAGNOSIS — M1710 Unilateral primary osteoarthritis, unspecified knee: Secondary | ICD-10-CM | POA: Diagnosis not present

## 2023-06-10 DIAGNOSIS — F3341 Major depressive disorder, recurrent, in partial remission: Secondary | ICD-10-CM | POA: Diagnosis not present

## 2023-06-10 DIAGNOSIS — G44209 Tension-type headache, unspecified, not intractable: Secondary | ICD-10-CM | POA: Diagnosis not present

## 2023-06-10 DIAGNOSIS — M5137 Other intervertebral disc degeneration, lumbosacral region with discogenic back pain only: Secondary | ICD-10-CM | POA: Diagnosis not present

## 2023-06-10 DIAGNOSIS — F0283 Dementia in other diseases classified elsewhere, unspecified severity, with mood disturbance: Secondary | ICD-10-CM | POA: Diagnosis not present

## 2023-06-10 DIAGNOSIS — G301 Alzheimer's disease with late onset: Secondary | ICD-10-CM | POA: Diagnosis not present

## 2023-06-13 DIAGNOSIS — F3341 Major depressive disorder, recurrent, in partial remission: Secondary | ICD-10-CM | POA: Diagnosis not present

## 2023-06-13 DIAGNOSIS — M1710 Unilateral primary osteoarthritis, unspecified knee: Secondary | ICD-10-CM | POA: Diagnosis not present

## 2023-06-13 DIAGNOSIS — F0283 Dementia in other diseases classified elsewhere, unspecified severity, with mood disturbance: Secondary | ICD-10-CM | POA: Diagnosis not present

## 2023-06-13 DIAGNOSIS — M5137 Other intervertebral disc degeneration, lumbosacral region with discogenic back pain only: Secondary | ICD-10-CM | POA: Diagnosis not present

## 2023-06-13 DIAGNOSIS — G301 Alzheimer's disease with late onset: Secondary | ICD-10-CM | POA: Diagnosis not present

## 2023-06-13 DIAGNOSIS — G44209 Tension-type headache, unspecified, not intractable: Secondary | ICD-10-CM | POA: Diagnosis not present

## 2023-06-17 DIAGNOSIS — G44209 Tension-type headache, unspecified, not intractable: Secondary | ICD-10-CM | POA: Diagnosis not present

## 2023-06-17 DIAGNOSIS — M5137 Other intervertebral disc degeneration, lumbosacral region with discogenic back pain only: Secondary | ICD-10-CM | POA: Diagnosis not present

## 2023-06-17 DIAGNOSIS — F0283 Dementia in other diseases classified elsewhere, unspecified severity, with mood disturbance: Secondary | ICD-10-CM | POA: Diagnosis not present

## 2023-06-17 DIAGNOSIS — G301 Alzheimer's disease with late onset: Secondary | ICD-10-CM | POA: Diagnosis not present

## 2023-06-17 DIAGNOSIS — F3341 Major depressive disorder, recurrent, in partial remission: Secondary | ICD-10-CM | POA: Diagnosis not present

## 2023-06-17 DIAGNOSIS — M1710 Unilateral primary osteoarthritis, unspecified knee: Secondary | ICD-10-CM | POA: Diagnosis not present

## 2023-06-18 DIAGNOSIS — M5137 Other intervertebral disc degeneration, lumbosacral region with discogenic back pain only: Secondary | ICD-10-CM | POA: Diagnosis not present

## 2023-06-18 DIAGNOSIS — G301 Alzheimer's disease with late onset: Secondary | ICD-10-CM | POA: Diagnosis not present

## 2023-06-18 DIAGNOSIS — F0283 Dementia in other diseases classified elsewhere, unspecified severity, with mood disturbance: Secondary | ICD-10-CM | POA: Diagnosis not present

## 2023-06-18 DIAGNOSIS — F3341 Major depressive disorder, recurrent, in partial remission: Secondary | ICD-10-CM | POA: Diagnosis not present

## 2023-06-18 DIAGNOSIS — G44209 Tension-type headache, unspecified, not intractable: Secondary | ICD-10-CM | POA: Diagnosis not present

## 2023-06-18 DIAGNOSIS — M1710 Unilateral primary osteoarthritis, unspecified knee: Secondary | ICD-10-CM | POA: Diagnosis not present

## 2023-06-25 DIAGNOSIS — G44209 Tension-type headache, unspecified, not intractable: Secondary | ICD-10-CM | POA: Diagnosis not present

## 2023-06-25 DIAGNOSIS — G301 Alzheimer's disease with late onset: Secondary | ICD-10-CM | POA: Diagnosis not present

## 2023-06-25 DIAGNOSIS — M1710 Unilateral primary osteoarthritis, unspecified knee: Secondary | ICD-10-CM | POA: Diagnosis not present

## 2023-06-25 DIAGNOSIS — F3341 Major depressive disorder, recurrent, in partial remission: Secondary | ICD-10-CM | POA: Diagnosis not present

## 2023-06-25 DIAGNOSIS — M5137 Other intervertebral disc degeneration, lumbosacral region with discogenic back pain only: Secondary | ICD-10-CM | POA: Diagnosis not present

## 2023-06-25 DIAGNOSIS — F0283 Dementia in other diseases classified elsewhere, unspecified severity, with mood disturbance: Secondary | ICD-10-CM | POA: Diagnosis not present

## 2023-06-26 ENCOUNTER — Ambulatory Visit: Payer: Medicare Other | Admitting: Podiatry

## 2023-06-27 ENCOUNTER — Ambulatory Visit: Payer: Medicare Other | Admitting: Podiatry

## 2023-06-27 DIAGNOSIS — M1710 Unilateral primary osteoarthritis, unspecified knee: Secondary | ICD-10-CM | POA: Diagnosis not present

## 2023-06-27 DIAGNOSIS — G44209 Tension-type headache, unspecified, not intractable: Secondary | ICD-10-CM | POA: Diagnosis not present

## 2023-06-27 DIAGNOSIS — M5137 Other intervertebral disc degeneration, lumbosacral region with discogenic back pain only: Secondary | ICD-10-CM | POA: Diagnosis not present

## 2023-06-27 DIAGNOSIS — F3341 Major depressive disorder, recurrent, in partial remission: Secondary | ICD-10-CM | POA: Diagnosis not present

## 2023-06-27 DIAGNOSIS — F0283 Dementia in other diseases classified elsewhere, unspecified severity, with mood disturbance: Secondary | ICD-10-CM | POA: Diagnosis not present

## 2023-06-27 DIAGNOSIS — G301 Alzheimer's disease with late onset: Secondary | ICD-10-CM | POA: Diagnosis not present

## 2023-06-30 DIAGNOSIS — F3341 Major depressive disorder, recurrent, in partial remission: Secondary | ICD-10-CM | POA: Diagnosis not present

## 2023-06-30 DIAGNOSIS — J309 Allergic rhinitis, unspecified: Secondary | ICD-10-CM | POA: Diagnosis not present

## 2023-06-30 DIAGNOSIS — F0283 Dementia in other diseases classified elsewhere, unspecified severity, with mood disturbance: Secondary | ICD-10-CM | POA: Diagnosis not present

## 2023-06-30 DIAGNOSIS — E7800 Pure hypercholesterolemia, unspecified: Secondary | ICD-10-CM | POA: Diagnosis not present

## 2023-06-30 DIAGNOSIS — M1710 Unilateral primary osteoarthritis, unspecified knee: Secondary | ICD-10-CM | POA: Diagnosis not present

## 2023-06-30 DIAGNOSIS — Z9011 Acquired absence of right breast and nipple: Secondary | ICD-10-CM | POA: Diagnosis not present

## 2023-06-30 DIAGNOSIS — E538 Deficiency of other specified B group vitamins: Secondary | ICD-10-CM | POA: Diagnosis not present

## 2023-06-30 DIAGNOSIS — M810 Age-related osteoporosis without current pathological fracture: Secondary | ICD-10-CM | POA: Diagnosis not present

## 2023-06-30 DIAGNOSIS — R5382 Chronic fatigue, unspecified: Secondary | ICD-10-CM | POA: Diagnosis not present

## 2023-06-30 DIAGNOSIS — G44209 Tension-type headache, unspecified, not intractable: Secondary | ICD-10-CM | POA: Diagnosis not present

## 2023-06-30 DIAGNOSIS — Z853 Personal history of malignant neoplasm of breast: Secondary | ICD-10-CM | POA: Diagnosis not present

## 2023-06-30 DIAGNOSIS — M549 Dorsalgia, unspecified: Secondary | ICD-10-CM | POA: Diagnosis not present

## 2023-06-30 DIAGNOSIS — G301 Alzheimer's disease with late onset: Secondary | ICD-10-CM | POA: Diagnosis not present

## 2023-06-30 DIAGNOSIS — Z9181 History of falling: Secondary | ICD-10-CM | POA: Diagnosis not present

## 2023-06-30 DIAGNOSIS — M5137 Other intervertebral disc degeneration, lumbosacral region with discogenic back pain only: Secondary | ICD-10-CM | POA: Diagnosis not present

## 2023-07-01 DIAGNOSIS — M5137 Other intervertebral disc degeneration, lumbosacral region with discogenic back pain only: Secondary | ICD-10-CM | POA: Diagnosis not present

## 2023-07-01 DIAGNOSIS — G44209 Tension-type headache, unspecified, not intractable: Secondary | ICD-10-CM | POA: Diagnosis not present

## 2023-07-01 DIAGNOSIS — G301 Alzheimer's disease with late onset: Secondary | ICD-10-CM | POA: Diagnosis not present

## 2023-07-01 DIAGNOSIS — M1710 Unilateral primary osteoarthritis, unspecified knee: Secondary | ICD-10-CM | POA: Diagnosis not present

## 2023-07-01 DIAGNOSIS — F0283 Dementia in other diseases classified elsewhere, unspecified severity, with mood disturbance: Secondary | ICD-10-CM | POA: Diagnosis not present

## 2023-07-01 DIAGNOSIS — F3341 Major depressive disorder, recurrent, in partial remission: Secondary | ICD-10-CM | POA: Diagnosis not present

## 2023-07-03 DIAGNOSIS — F0283 Dementia in other diseases classified elsewhere, unspecified severity, with mood disturbance: Secondary | ICD-10-CM | POA: Diagnosis not present

## 2023-07-03 DIAGNOSIS — M1710 Unilateral primary osteoarthritis, unspecified knee: Secondary | ICD-10-CM | POA: Diagnosis not present

## 2023-07-03 DIAGNOSIS — F3341 Major depressive disorder, recurrent, in partial remission: Secondary | ICD-10-CM | POA: Diagnosis not present

## 2023-07-03 DIAGNOSIS — G301 Alzheimer's disease with late onset: Secondary | ICD-10-CM | POA: Diagnosis not present

## 2023-07-03 DIAGNOSIS — G44209 Tension-type headache, unspecified, not intractable: Secondary | ICD-10-CM | POA: Diagnosis not present

## 2023-07-03 DIAGNOSIS — M5137 Other intervertebral disc degeneration, lumbosacral region with discogenic back pain only: Secondary | ICD-10-CM | POA: Diagnosis not present

## 2023-07-08 DIAGNOSIS — F0283 Dementia in other diseases classified elsewhere, unspecified severity, with mood disturbance: Secondary | ICD-10-CM | POA: Diagnosis not present

## 2023-07-08 DIAGNOSIS — F3341 Major depressive disorder, recurrent, in partial remission: Secondary | ICD-10-CM | POA: Diagnosis not present

## 2023-07-08 DIAGNOSIS — M5137 Other intervertebral disc degeneration, lumbosacral region with discogenic back pain only: Secondary | ICD-10-CM | POA: Diagnosis not present

## 2023-07-08 DIAGNOSIS — M1710 Unilateral primary osteoarthritis, unspecified knee: Secondary | ICD-10-CM | POA: Diagnosis not present

## 2023-07-08 DIAGNOSIS — G301 Alzheimer's disease with late onset: Secondary | ICD-10-CM | POA: Diagnosis not present

## 2023-07-08 DIAGNOSIS — G44209 Tension-type headache, unspecified, not intractable: Secondary | ICD-10-CM | POA: Diagnosis not present

## 2023-07-09 ENCOUNTER — Ambulatory Visit: Payer: Medicare Other | Admitting: Physician Assistant

## 2023-07-10 DIAGNOSIS — F0390 Unspecified dementia without behavioral disturbance: Secondary | ICD-10-CM | POA: Diagnosis not present

## 2023-07-10 DIAGNOSIS — G301 Alzheimer's disease with late onset: Secondary | ICD-10-CM | POA: Diagnosis not present

## 2023-07-10 DIAGNOSIS — F3341 Major depressive disorder, recurrent, in partial remission: Secondary | ICD-10-CM | POA: Diagnosis not present

## 2023-07-10 DIAGNOSIS — Z0000 Encounter for general adult medical examination without abnormal findings: Secondary | ICD-10-CM | POA: Diagnosis not present

## 2023-07-10 DIAGNOSIS — Z1331 Encounter for screening for depression: Secondary | ICD-10-CM | POA: Diagnosis not present

## 2023-07-10 DIAGNOSIS — E7800 Pure hypercholesterolemia, unspecified: Secondary | ICD-10-CM | POA: Diagnosis not present

## 2023-07-10 DIAGNOSIS — Z23 Encounter for immunization: Secondary | ICD-10-CM | POA: Diagnosis not present

## 2023-07-10 DIAGNOSIS — M810 Age-related osteoporosis without current pathological fracture: Secondary | ICD-10-CM | POA: Diagnosis not present

## 2023-07-11 ENCOUNTER — Other Ambulatory Visit: Payer: Self-pay | Admitting: Physician Assistant

## 2023-07-17 DIAGNOSIS — F3341 Major depressive disorder, recurrent, in partial remission: Secondary | ICD-10-CM | POA: Diagnosis not present

## 2023-07-17 DIAGNOSIS — G44209 Tension-type headache, unspecified, not intractable: Secondary | ICD-10-CM | POA: Diagnosis not present

## 2023-07-17 DIAGNOSIS — M1710 Unilateral primary osteoarthritis, unspecified knee: Secondary | ICD-10-CM | POA: Diagnosis not present

## 2023-07-17 DIAGNOSIS — M5137 Other intervertebral disc degeneration, lumbosacral region with discogenic back pain only: Secondary | ICD-10-CM | POA: Diagnosis not present

## 2023-07-17 DIAGNOSIS — F0283 Dementia in other diseases classified elsewhere, unspecified severity, with mood disturbance: Secondary | ICD-10-CM | POA: Diagnosis not present

## 2023-07-17 DIAGNOSIS — G301 Alzheimer's disease with late onset: Secondary | ICD-10-CM | POA: Diagnosis not present

## 2023-07-24 ENCOUNTER — Encounter: Payer: Self-pay | Admitting: Podiatry

## 2023-07-24 ENCOUNTER — Ambulatory Visit (INDEPENDENT_AMBULATORY_CARE_PROVIDER_SITE_OTHER): Payer: Medicare Other | Admitting: Podiatry

## 2023-07-24 DIAGNOSIS — B351 Tinea unguium: Secondary | ICD-10-CM | POA: Diagnosis not present

## 2023-07-24 DIAGNOSIS — M79674 Pain in right toe(s): Secondary | ICD-10-CM | POA: Diagnosis not present

## 2023-07-24 DIAGNOSIS — M79675 Pain in left toe(s): Secondary | ICD-10-CM

## 2023-07-24 NOTE — Progress Notes (Signed)
This patient presents to the office with chief complaint of long thick painful nails.  Patient says the nails are painful walking and wearing shoes.  This patient is unable to self treat.  This patient is unable to trim her nails since she is unable to reach her nails. She presents to the office with her daughter. She presents to the office for preventative foot care services.   General Appearance  Alert, conversant and in no acute stress.  Vascular  Dorsalis pedis and posterior tibial  pulses are weakly  palpable  bilaterally.  Capillary return is within normal limits  bilaterally. Temperature is within normal limits  bilaterally.  Neurologic  Senn-Weinstein monofilament wire test within normal limits  bilaterally. Muscle power within normal limits bilaterally.  Nails Thick disfigured discolored nails with subungual debris  from hallux to fifth toes bilaterally. No evidence of bacterial infection or drainage bilaterally.  Orthopedic  No limitations of motion  feet .  No crepitus or effusions noted.  No bony pathology or digital deformities noted.  Skin  normotropic skin with no porokeratosis noted bilaterally.  No signs of infections or ulcers noted.     Onychomycosis  Nails  B/L.  Pain in right toes  Pain in left toes  Debridement of nails both feet followed trimming the nails with dremel tool.    RTC 3 months.   Gardiner Barefoot DPM

## 2023-07-25 DIAGNOSIS — F0283 Dementia in other diseases classified elsewhere, unspecified severity, with mood disturbance: Secondary | ICD-10-CM | POA: Diagnosis not present

## 2023-07-25 DIAGNOSIS — M1710 Unilateral primary osteoarthritis, unspecified knee: Secondary | ICD-10-CM | POA: Diagnosis not present

## 2023-07-25 DIAGNOSIS — G44209 Tension-type headache, unspecified, not intractable: Secondary | ICD-10-CM | POA: Diagnosis not present

## 2023-07-25 DIAGNOSIS — M5137 Other intervertebral disc degeneration, lumbosacral region with discogenic back pain only: Secondary | ICD-10-CM | POA: Diagnosis not present

## 2023-07-25 DIAGNOSIS — G301 Alzheimer's disease with late onset: Secondary | ICD-10-CM | POA: Diagnosis not present

## 2023-07-25 DIAGNOSIS — F3341 Major depressive disorder, recurrent, in partial remission: Secondary | ICD-10-CM | POA: Diagnosis not present

## 2023-07-30 DIAGNOSIS — M549 Dorsalgia, unspecified: Secondary | ICD-10-CM | POA: Diagnosis not present

## 2023-07-30 DIAGNOSIS — F0283 Dementia in other diseases classified elsewhere, unspecified severity, with mood disturbance: Secondary | ICD-10-CM | POA: Diagnosis not present

## 2023-07-30 DIAGNOSIS — G301 Alzheimer's disease with late onset: Secondary | ICD-10-CM | POA: Diagnosis not present

## 2023-07-30 DIAGNOSIS — J309 Allergic rhinitis, unspecified: Secondary | ICD-10-CM | POA: Diagnosis not present

## 2023-07-30 DIAGNOSIS — M810 Age-related osteoporosis without current pathological fracture: Secondary | ICD-10-CM | POA: Diagnosis not present

## 2023-07-30 DIAGNOSIS — F3341 Major depressive disorder, recurrent, in partial remission: Secondary | ICD-10-CM | POA: Diagnosis not present

## 2023-07-30 DIAGNOSIS — R5382 Chronic fatigue, unspecified: Secondary | ICD-10-CM | POA: Diagnosis not present

## 2023-07-30 DIAGNOSIS — E538 Deficiency of other specified B group vitamins: Secondary | ICD-10-CM | POA: Diagnosis not present

## 2023-07-30 DIAGNOSIS — M1710 Unilateral primary osteoarthritis, unspecified knee: Secondary | ICD-10-CM | POA: Diagnosis not present

## 2023-07-30 DIAGNOSIS — G44209 Tension-type headache, unspecified, not intractable: Secondary | ICD-10-CM | POA: Diagnosis not present

## 2023-07-30 DIAGNOSIS — E7800 Pure hypercholesterolemia, unspecified: Secondary | ICD-10-CM | POA: Diagnosis not present

## 2023-07-30 DIAGNOSIS — Z9181 History of falling: Secondary | ICD-10-CM | POA: Diagnosis not present

## 2023-07-30 DIAGNOSIS — Z853 Personal history of malignant neoplasm of breast: Secondary | ICD-10-CM | POA: Diagnosis not present

## 2023-07-30 DIAGNOSIS — Z9011 Acquired absence of right breast and nipple: Secondary | ICD-10-CM | POA: Diagnosis not present

## 2023-07-31 DIAGNOSIS — F3341 Major depressive disorder, recurrent, in partial remission: Secondary | ICD-10-CM | POA: Diagnosis not present

## 2023-07-31 DIAGNOSIS — E538 Deficiency of other specified B group vitamins: Secondary | ICD-10-CM | POA: Diagnosis not present

## 2023-07-31 DIAGNOSIS — G301 Alzheimer's disease with late onset: Secondary | ICD-10-CM | POA: Diagnosis not present

## 2023-07-31 DIAGNOSIS — G44209 Tension-type headache, unspecified, not intractable: Secondary | ICD-10-CM | POA: Diagnosis not present

## 2023-07-31 DIAGNOSIS — F0283 Dementia in other diseases classified elsewhere, unspecified severity, with mood disturbance: Secondary | ICD-10-CM | POA: Diagnosis not present

## 2023-07-31 DIAGNOSIS — M1710 Unilateral primary osteoarthritis, unspecified knee: Secondary | ICD-10-CM | POA: Diagnosis not present

## 2023-08-01 DIAGNOSIS — F3341 Major depressive disorder, recurrent, in partial remission: Secondary | ICD-10-CM | POA: Diagnosis not present

## 2023-08-01 DIAGNOSIS — F0283 Dementia in other diseases classified elsewhere, unspecified severity, with mood disturbance: Secondary | ICD-10-CM | POA: Diagnosis not present

## 2023-08-01 DIAGNOSIS — G44209 Tension-type headache, unspecified, not intractable: Secondary | ICD-10-CM | POA: Diagnosis not present

## 2023-08-01 DIAGNOSIS — M1710 Unilateral primary osteoarthritis, unspecified knee: Secondary | ICD-10-CM | POA: Diagnosis not present

## 2023-08-01 DIAGNOSIS — E538 Deficiency of other specified B group vitamins: Secondary | ICD-10-CM | POA: Diagnosis not present

## 2023-08-01 DIAGNOSIS — G301 Alzheimer's disease with late onset: Secondary | ICD-10-CM | POA: Diagnosis not present

## 2023-08-06 DIAGNOSIS — F0283 Dementia in other diseases classified elsewhere, unspecified severity, with mood disturbance: Secondary | ICD-10-CM | POA: Diagnosis not present

## 2023-08-06 DIAGNOSIS — E538 Deficiency of other specified B group vitamins: Secondary | ICD-10-CM | POA: Diagnosis not present

## 2023-08-06 DIAGNOSIS — F3341 Major depressive disorder, recurrent, in partial remission: Secondary | ICD-10-CM | POA: Diagnosis not present

## 2023-08-06 DIAGNOSIS — G301 Alzheimer's disease with late onset: Secondary | ICD-10-CM | POA: Diagnosis not present

## 2023-08-06 DIAGNOSIS — M1710 Unilateral primary osteoarthritis, unspecified knee: Secondary | ICD-10-CM | POA: Diagnosis not present

## 2023-08-06 DIAGNOSIS — G44209 Tension-type headache, unspecified, not intractable: Secondary | ICD-10-CM | POA: Diagnosis not present

## 2023-08-09 DIAGNOSIS — F3341 Major depressive disorder, recurrent, in partial remission: Secondary | ICD-10-CM | POA: Diagnosis not present

## 2023-08-09 DIAGNOSIS — E538 Deficiency of other specified B group vitamins: Secondary | ICD-10-CM | POA: Diagnosis not present

## 2023-08-09 DIAGNOSIS — F0283 Dementia in other diseases classified elsewhere, unspecified severity, with mood disturbance: Secondary | ICD-10-CM | POA: Diagnosis not present

## 2023-08-09 DIAGNOSIS — G301 Alzheimer's disease with late onset: Secondary | ICD-10-CM | POA: Diagnosis not present

## 2023-08-09 DIAGNOSIS — M1710 Unilateral primary osteoarthritis, unspecified knee: Secondary | ICD-10-CM | POA: Diagnosis not present

## 2023-08-09 DIAGNOSIS — G44209 Tension-type headache, unspecified, not intractable: Secondary | ICD-10-CM | POA: Diagnosis not present

## 2023-08-12 DIAGNOSIS — M1710 Unilateral primary osteoarthritis, unspecified knee: Secondary | ICD-10-CM | POA: Diagnosis not present

## 2023-08-12 DIAGNOSIS — F0283 Dementia in other diseases classified elsewhere, unspecified severity, with mood disturbance: Secondary | ICD-10-CM | POA: Diagnosis not present

## 2023-08-12 DIAGNOSIS — F3341 Major depressive disorder, recurrent, in partial remission: Secondary | ICD-10-CM | POA: Diagnosis not present

## 2023-08-12 DIAGNOSIS — G44209 Tension-type headache, unspecified, not intractable: Secondary | ICD-10-CM | POA: Diagnosis not present

## 2023-08-12 DIAGNOSIS — E538 Deficiency of other specified B group vitamins: Secondary | ICD-10-CM | POA: Diagnosis not present

## 2023-08-12 DIAGNOSIS — G301 Alzheimer's disease with late onset: Secondary | ICD-10-CM | POA: Diagnosis not present

## 2023-08-15 ENCOUNTER — Encounter: Payer: Self-pay | Admitting: Physician Assistant

## 2023-08-15 ENCOUNTER — Ambulatory Visit (INDEPENDENT_AMBULATORY_CARE_PROVIDER_SITE_OTHER): Payer: Medicare Other | Admitting: Physician Assistant

## 2023-08-15 VITALS — BP 144/80 | HR 79 | Wt 147.4 lb

## 2023-08-15 DIAGNOSIS — G301 Alzheimer's disease with late onset: Secondary | ICD-10-CM | POA: Diagnosis not present

## 2023-08-15 DIAGNOSIS — M1710 Unilateral primary osteoarthritis, unspecified knee: Secondary | ICD-10-CM | POA: Diagnosis not present

## 2023-08-15 DIAGNOSIS — E538 Deficiency of other specified B group vitamins: Secondary | ICD-10-CM | POA: Diagnosis not present

## 2023-08-15 DIAGNOSIS — F0280 Dementia in other diseases classified elsewhere without behavioral disturbance: Secondary | ICD-10-CM | POA: Diagnosis not present

## 2023-08-15 DIAGNOSIS — F0283 Dementia in other diseases classified elsewhere, unspecified severity, with mood disturbance: Secondary | ICD-10-CM | POA: Diagnosis not present

## 2023-08-15 DIAGNOSIS — G44209 Tension-type headache, unspecified, not intractable: Secondary | ICD-10-CM | POA: Diagnosis not present

## 2023-08-15 DIAGNOSIS — F3341 Major depressive disorder, recurrent, in partial remission: Secondary | ICD-10-CM | POA: Diagnosis not present

## 2023-08-15 MED ORDER — MEMANTINE HCL 10 MG PO TABS: ORAL_TABLET | ORAL | 11 refills | Status: DC

## 2023-08-15 MED ORDER — DONEPEZIL HCL 10 MG PO TABS: ORAL_TABLET | ORAL | 11 refills | Status: DC

## 2023-08-15 NOTE — Patient Instructions (Signed)
It was a pleasure to see you today at our office.   Recommendations:  Follow up in 6 months. Continue donepezil 10 mg daily. Side effects were discussed  Continue Memantine 10 mg twice daily. Side effects were discussed    Whom to call:  Memory  decline, memory medications: Call our office [phone]   For psychiatric meds, mood meds: Please have your primary care physician manage these medications.       For assessment of decision of mental capacity and competency:  Call Dr. Erick Blinks, geriatric psychiatrist at [phone]  For guidance in geriatric dementia issues please call [facility] [phone]  For guidance regarding [facility] and if placement were needed at the facility, contact Sidney Ace, Social Worker tel: [phone]  If you have any severe symptoms of a stroke, or other severe issues such as confusion,severe chills or fever, etc call 911 or go to the ER as you may need to be evaluated further   Feel free to visit Facebook page " [email or web address]" for tips of how to care for people with memory problems.     RECOMMENDATIONS FOR ALL PATIENTS WITH MEMORY PROBLEMS: 1. Continue to exercise (Recommend 30 minutes of walking everyday, or 3 hours every week) 2. Increase social interactions - continue going to [place] and enjoy social gatherings with friends and family 3. Eat healthy, avoid fried foods and eat more fruits and vegetables 4. Maintain adequate blood pressure, blood sugar, and blood cholesterol level. Reducing the risk of stroke and cardiovascular disease also helps promoting better memory. 5. Avoid stressful situations. Live a simple life and avoid aggravations. Organize your time and prepare for the next day in anticipation. 6. Sleep well, avoid any interruptions of sleep and avoid any distractions in the bedroom that may interfere with adequate sleep quality 7. Avoid sugar, avoid sweets as there is a strong link between  excessive sugar intake, diabetes, and cognitive impairment We discussed the Mediterranean diet, which has been shown to help patients reduce the risk of progressive memory disorders and reduces cardiovascular risk. This includes eating fish, eat fruits and green leafy vegetables, nuts like almonds and hazelnuts, walnuts, and also use olive oil. Avoid fast foods and fried foods as much as possible. Avoid sweets and sugar as sugar use has been linked to worsening of memory function.  There is always a concern of gradual progression of memory problems. If this is the case, then we may need to adjust level of care according to patient needs. Support, both to the patient and caregiver, should then be put into place.    FALL PRECAUTIONS: Be cautious when walking. Scan the area for obstacles that may increase the risk of trips and falls. When getting up in the mornings, sit up at the edge of the bed for a few minutes before getting out of bed. Consider elevating the bed at the head end to avoid drop of blood pressure when getting up. Walk always in a well-lit room (use night lights in the walls). Avoid area rugs or power cords from appliances in the middle of the walkways. Use a walker or a cane if necessary and consider physical therapy for balance exercise. Get your eyesight checked regularly.  FINANCIAL OVERSIGHT: Supervision, especially oversight when making financial decisions or transactions is also recommended.  HOME SAFETY: Consider the safety of the kitchen when operating appliances like stoves, microwave oven, and blender. Consider having supervision and share cooking responsibilities until no longer able to participate  in those. Accidents with firearms and other hazards in the house should be identified and addressed as well.   ABILITY TO BE LEFT ALONE: If patient is unable to contact 911 operator, consider using [employer], or when the need is there, arrange for someone to stay with patients. Smoking  is a fire hazard, consider supervision or cessation. Risk of wandering should be assessed by caregiver and if detected at any point, supervision and safe proof recommendations should be instituted.  MEDICATION SUPERVISION: Inability to self-administer medication needs to be constantly addressed. Implement a mechanism to ensure safe administration of the medications.   DRIVING: Regarding driving, in patients with progressive memory problems, driving will be impaired. We advise to have someone else do the driving if trouble finding directions or if minor accidents are reported. Independent driving assessment is available to determine safety of driving.   If you are interested in the driving assessment, you can contact the following:  [employer] in [place] [phone]  [facility] [phone]  [facility] [phone]  [facility] [phone] or [phone]

## 2023-08-15 NOTE — Addendum Note (Signed)
Addended by: Marlowe Kays E on: 08/15/2023 03:36 PM   Modules accepted: Level of Service

## 2023-08-15 NOTE — Progress Notes (Addendum)
Assessment/Plan:   Dementia likely due to Alzheimer's Disease  Melody Wilson is a very pleasant 89 y.o. RH female with a history of chronic fatigue syndrome, prior breast cancer, B12 deficiency, osteoporosis, hyperlipidemia, depression in remission, dementia likely due to Alzheimer's disease seen today in follow up for memory loss. Patient is currently on  donepezil 10 mg daily and memantine 10 mg twice daily. She requires some help with ADLs but tries to remain active. MMSE today is stable, at  10/30. Mood is good.     Follow up in 6  months. Continue donepezil 10 mg daily. Side effects were discussed  Continue Memantine 10 mg twice daily. Side effects were discussed  Recommend good control of her cardiovascular risk factors Continue to control mood as per PCP     Subjective:    This patient is accompanied in the office by her son  who supplements the history.  Previous records as well as any outside records available were reviewed prior to todays visit. Patient was last seen on 01/08/23 with MMSE 20/30    Any changes in memory since last visit? "Some good and some bad days, but mostly good".  She remains fairly active on her ADLs, happy at [facility] [place]. She hs a good social life. She does ST which is beneficial to her repeats oneself?  Endorsed but not very often Disoriented when walking into a room?  Patient denies    Leaving objects?  May misplace things but not in unusual places    Wandering behavior?  denies   Any personality changes since last visit?  denies   Any worsening depression?:  Denies.   Hallucinations or paranoia?  Denies.   Seizures? denies    Any sleep changes?  Sleeps well. Denies vivid dreams, REM behavior or sleepwalking   Sleep apnea?   Denies.   Any hygiene concerns? Denies.  Independent of bathing and dressing?  Endorsed  Does the patient needs help with medications? Son is in charge   Who is in charge of the finances? Son  is in charge     Any changes  in appetite?  Denies, she likes to eat.      Patient have trouble swallowing? Denies.   Does the patient cook? No Any headaches?  She has a history of tension headaches, well-controlled. Chronic back pain  denies   Ambulates with difficulty? Denies.    Recent falls or head injuries? denies     Unilateral weakness, numbness or tingling? denies   Any tremors?  Denies   Any anosmia?  Denies   Any incontinence of urine? Denies any issues Any bowel dysfunction? Occasional diarrhea      Patient lives at [facility] [place].    Does the patient drive? No longer drives    "HISTORY OF PRESENT ILLNESS (from 04/15/2020):  This is a pleasant 89 year old right-handed woman with a history of breast cancer presenting for evaluation of dementia. Her 2 sons Onalee Hua and Leeann Must are present and provided additional information privately as well. The patient feels her memory is "ok for my age." Her sons started noticing short-term memory changes for the past few years, but more concerning recently. She forgets dates and appointments. Bills were not getting paid, her sons report partly due to being unable to get to places to physically pay them being a challenge. She had not paid the homeowners fee last May. She gets confused when he asks her questions. She gets frustrated and would shut down,  not wanting to talk. She continues to drive locally and denies getting lost, sons have not gotten any calls about this. They state she hardly leaves the house. She is not taking any regular medications. She used to have Tramadol for her back pain but she had started taking it daily so her sons took it out of the house. She takes Aleve prn. She does not cook as much and denies leaving the stove on. She has noticed she misplaces things, she has to double check things more. Her sons report she is writing things down and making lists. She is independent with dressing and bathing, no hygiene concerns, house is taken care of. No paranoia or  hallucinations. There was only one incident in the early Spring where she was convinced David's neighbor left a pile of wood in her neighbor's lawn, when he did not. No family history of dementia. No history of concussions or alcohol use.    She has had a daily headache for the past couple of months. She reports an achy pain on the vertex, like a headband, lasting a few hours. She does not take the Aleve daily for the headaches. There is no associated nausea/vomiting, photo/phonophobia, vision changes. She denies any dizziness, diplopia, dysarthria/dysphagia, bowel/bladder dysfunction, anosmia, or tremors. She reports pain going down her left arm and arthritis in both hands. Sleep is good, no wandering behavior. No falls. "    Neurocognitive Feedback Note (Dr. Clayborn Heron, 05/26/20) :   "..We discussed impression of mild stage dementia. We had a long discussion about necessary supports at this stage of the disease, which in Ms. Mccubbins's case is mostly already being done, such as finance and medications. We also discussed settings that are appropriate for individuals with mild stage AD, including home with some assistance, assisted living, and independent living. Ms. Ocker son's are concerned that she is quite isolated although she is hesitant to leave the home, and we processed that. Marland Kitchen.There are no safety concerns at present."  PREVIOUS MEDICATIONS:   CURRENT MEDICATIONS:  Outpatient Encounter Medications as of 08/15/2023  Medication Sig   aspirin 81 MG tablet Take 81 mg by mouth daily.   gabapentin (NEURONTIN) 100 MG capsule Take 1 capsule (100 mg total) by mouth at bedtime.   Multiple Vitamins-Minerals (MULTIVITAMIN WITH MINERALS) tablet Take 1 tablet by mouth daily.   naproxen sodium (ANAPROX) 220 MG tablet Take 220 mg by mouth 2 (two) times daily with a meal.   vitamin C (ASCORBIC ACID) 500 MG tablet Take 500 mg by mouth daily.   vitamin E 400 UNIT capsule Take 400 Units by mouth daily.    [DISCONTINUED] donepezil (ARICEPT) 10 MG tablet Take 1 tablet by mouth once daily   [DISCONTINUED] memantine (NAMENDA) 10 MG tablet TAKE 1/2 (ONE-HALF) TABLET BY MOUTH AT NIGHT FOR 14 DAYS ,  THEN  INCREASE  TO  1  TABLET  NIGHTLY   donepezil (ARICEPT) 10 MG tablet Take 1 tablet by mouth once daily   memantine (NAMENDA) 10 MG tablet TAKE  1 tablet  twice a day   No facility-administered encounter medications on file as of 08/15/2023.       08/15/2023    3:00 PM 01/08/2023    5:00 PM 06/22/2022    3:00 PM  MMSE - Mini Mental State Exam  Orientation to time 0 1 4  Orientation to Place 3 5 5   Registration 3 3 3   Attention/ Calculation 5 4 5   Recall 0 0 0  Language- name 2 objects 2 2 1   Language- repeat 1 1 1   Language- follow 3 step command 3 3 3   Language- read & follow direction 1 1 1   Write a sentence 1 0 0  Copy design 1 0 0  Total score 20 20 23        No data to display          Objective:     PHYSICAL EXAMINATION:    VITALS:   Vitals:   08/15/23 1507  BP: (!) 144/80  Pulse: 79  SpO2: 98%  Weight: 147 lb 6.4 oz (66.9 kg)    GEN:  The patient appears stated age and is in NAD. HEENT:  Normocephalic, atraumatic.   Neurological examination:  General: NAD, well-groomed, appears stated age. Orientation: The patient is alert. Oriented to person, not to place or date Cranial nerves: There is good facial symmetry.The speech is fluent and clear. No aphasia or dysarthria. Fund of knowledge is appropriate. Recent and remote memory are impaired. Attention and concentration are reduced.  Able to name objects and repeat phrases.  Hearing is intact to conversational tone.   Sensation: Sensation is intact to light touch throughout Motor: Strength is at least antigravity x4. DTR's 2/4 in UE/LE     Movement examination: Tone: There is normal tone in the UE/LE Abnormal movements:  no tremor.  No myoclonus.  No asterixis.   Coordination:  There is no decremation with  RAM's. Normal finger to nose  Gait and Station: The patient has no  difficulty arising out of a deep-seated chair without the use of the hands. The patient's stride length is good.  Gait is cautious and narrow.    Thank you for allowing [place] the opportunity to participate in the care of this nice patient. Please do not hesitate to contact us for any questions or concerns.   Total time spent on today's visit was 30 minutes dedicated to this patient today, preparing to see patient, examining the patient, ordering tests and/or medications and counseling the patient, documenting clinical information in the EHR or other health record, independently interpreting results and communicating results to the patient/family, discussing treatment and goals, answering patient's questions and coordinating care.  Cc:  Mila Palmer, MD  Marlowe Kays 08/15/2023 3:35 PM

## 2023-08-18 DIAGNOSIS — M1710 Unilateral primary osteoarthritis, unspecified knee: Secondary | ICD-10-CM | POA: Diagnosis not present

## 2023-08-18 DIAGNOSIS — G301 Alzheimer's disease with late onset: Secondary | ICD-10-CM | POA: Diagnosis not present

## 2023-08-18 DIAGNOSIS — E538 Deficiency of other specified B group vitamins: Secondary | ICD-10-CM | POA: Diagnosis not present

## 2023-08-18 DIAGNOSIS — F3341 Major depressive disorder, recurrent, in partial remission: Secondary | ICD-10-CM | POA: Diagnosis not present

## 2023-08-18 DIAGNOSIS — G44209 Tension-type headache, unspecified, not intractable: Secondary | ICD-10-CM | POA: Diagnosis not present

## 2023-08-18 DIAGNOSIS — F0283 Dementia in other diseases classified elsewhere, unspecified severity, with mood disturbance: Secondary | ICD-10-CM | POA: Diagnosis not present

## 2023-08-26 DIAGNOSIS — G44209 Tension-type headache, unspecified, not intractable: Secondary | ICD-10-CM | POA: Diagnosis not present

## 2023-08-26 DIAGNOSIS — G301 Alzheimer's disease with late onset: Secondary | ICD-10-CM | POA: Diagnosis not present

## 2023-08-26 DIAGNOSIS — M1710 Unilateral primary osteoarthritis, unspecified knee: Secondary | ICD-10-CM | POA: Diagnosis not present

## 2023-08-26 DIAGNOSIS — F3341 Major depressive disorder, recurrent, in partial remission: Secondary | ICD-10-CM | POA: Diagnosis not present

## 2023-08-26 DIAGNOSIS — E538 Deficiency of other specified B group vitamins: Secondary | ICD-10-CM | POA: Diagnosis not present

## 2023-08-26 DIAGNOSIS — F0283 Dementia in other diseases classified elsewhere, unspecified severity, with mood disturbance: Secondary | ICD-10-CM | POA: Diagnosis not present

## 2023-08-29 DIAGNOSIS — G301 Alzheimer's disease with late onset: Secondary | ICD-10-CM | POA: Diagnosis not present

## 2023-08-29 DIAGNOSIS — M1710 Unilateral primary osteoarthritis, unspecified knee: Secondary | ICD-10-CM | POA: Diagnosis not present

## 2023-08-29 DIAGNOSIS — E7800 Pure hypercholesterolemia, unspecified: Secondary | ICD-10-CM | POA: Diagnosis not present

## 2023-08-29 DIAGNOSIS — M549 Dorsalgia, unspecified: Secondary | ICD-10-CM | POA: Diagnosis not present

## 2023-08-29 DIAGNOSIS — F3341 Major depressive disorder, recurrent, in partial remission: Secondary | ICD-10-CM | POA: Diagnosis not present

## 2023-08-29 DIAGNOSIS — Z9011 Acquired absence of right breast and nipple: Secondary | ICD-10-CM | POA: Diagnosis not present

## 2023-08-29 DIAGNOSIS — M810 Age-related osteoporosis without current pathological fracture: Secondary | ICD-10-CM | POA: Diagnosis not present

## 2023-08-29 DIAGNOSIS — R5382 Chronic fatigue, unspecified: Secondary | ICD-10-CM | POA: Diagnosis not present

## 2023-08-29 DIAGNOSIS — Z9181 History of falling: Secondary | ICD-10-CM | POA: Diagnosis not present

## 2023-08-29 DIAGNOSIS — Z853 Personal history of malignant neoplasm of breast: Secondary | ICD-10-CM | POA: Diagnosis not present

## 2023-08-29 DIAGNOSIS — G44209 Tension-type headache, unspecified, not intractable: Secondary | ICD-10-CM | POA: Diagnosis not present

## 2023-08-29 DIAGNOSIS — F0283 Dementia in other diseases classified elsewhere, unspecified severity, with mood disturbance: Secondary | ICD-10-CM | POA: Diagnosis not present

## 2023-08-29 DIAGNOSIS — E538 Deficiency of other specified B group vitamins: Secondary | ICD-10-CM | POA: Diagnosis not present

## 2023-08-29 DIAGNOSIS — J309 Allergic rhinitis, unspecified: Secondary | ICD-10-CM | POA: Diagnosis not present

## 2023-09-02 DIAGNOSIS — E538 Deficiency of other specified B group vitamins: Secondary | ICD-10-CM | POA: Diagnosis not present

## 2023-09-02 DIAGNOSIS — F3341 Major depressive disorder, recurrent, in partial remission: Secondary | ICD-10-CM | POA: Diagnosis not present

## 2023-09-02 DIAGNOSIS — G301 Alzheimer's disease with late onset: Secondary | ICD-10-CM | POA: Diagnosis not present

## 2023-09-02 DIAGNOSIS — M1710 Unilateral primary osteoarthritis, unspecified knee: Secondary | ICD-10-CM | POA: Diagnosis not present

## 2023-09-02 DIAGNOSIS — G44209 Tension-type headache, unspecified, not intractable: Secondary | ICD-10-CM | POA: Diagnosis not present

## 2023-09-02 DIAGNOSIS — F0283 Dementia in other diseases classified elsewhere, unspecified severity, with mood disturbance: Secondary | ICD-10-CM | POA: Diagnosis not present

## 2023-09-09 DIAGNOSIS — E538 Deficiency of other specified B group vitamins: Secondary | ICD-10-CM | POA: Diagnosis not present

## 2023-09-09 DIAGNOSIS — G44209 Tension-type headache, unspecified, not intractable: Secondary | ICD-10-CM | POA: Diagnosis not present

## 2023-09-09 DIAGNOSIS — F0283 Dementia in other diseases classified elsewhere, unspecified severity, with mood disturbance: Secondary | ICD-10-CM | POA: Diagnosis not present

## 2023-09-09 DIAGNOSIS — G301 Alzheimer's disease with late onset: Secondary | ICD-10-CM | POA: Diagnosis not present

## 2023-09-09 DIAGNOSIS — F3341 Major depressive disorder, recurrent, in partial remission: Secondary | ICD-10-CM | POA: Diagnosis not present

## 2023-09-09 DIAGNOSIS — M1710 Unilateral primary osteoarthritis, unspecified knee: Secondary | ICD-10-CM | POA: Diagnosis not present

## 2023-09-18 DIAGNOSIS — G44209 Tension-type headache, unspecified, not intractable: Secondary | ICD-10-CM | POA: Diagnosis not present

## 2023-09-18 DIAGNOSIS — E538 Deficiency of other specified B group vitamins: Secondary | ICD-10-CM | POA: Diagnosis not present

## 2023-09-18 DIAGNOSIS — F3341 Major depressive disorder, recurrent, in partial remission: Secondary | ICD-10-CM | POA: Diagnosis not present

## 2023-09-18 DIAGNOSIS — M1710 Unilateral primary osteoarthritis, unspecified knee: Secondary | ICD-10-CM | POA: Diagnosis not present

## 2023-09-18 DIAGNOSIS — F0283 Dementia in other diseases classified elsewhere, unspecified severity, with mood disturbance: Secondary | ICD-10-CM | POA: Diagnosis not present

## 2023-09-18 DIAGNOSIS — G301 Alzheimer's disease with late onset: Secondary | ICD-10-CM | POA: Diagnosis not present

## 2023-10-22 ENCOUNTER — Ambulatory Visit (INDEPENDENT_AMBULATORY_CARE_PROVIDER_SITE_OTHER): Payer: Medicare Other | Admitting: Podiatry

## 2023-10-22 DIAGNOSIS — M79674 Pain in right toe(s): Secondary | ICD-10-CM

## 2023-10-22 DIAGNOSIS — M79675 Pain in left toe(s): Secondary | ICD-10-CM | POA: Diagnosis not present

## 2023-10-22 DIAGNOSIS — B351 Tinea unguium: Secondary | ICD-10-CM

## 2023-10-23 ENCOUNTER — Encounter: Payer: Self-pay | Admitting: Podiatry

## 2023-10-23 NOTE — Progress Notes (Signed)
This patient presents to the office with chief complaint of long thick painful nails.  Patient says the nails are painful walking and wearing shoes.  This patient is unable to self treat.  This patient is unable to trim her nails since she is unable to reach her nails. She presents to the office with her daughter. She presents to the office for preventative foot care services.   General Appearance  Alert, conversant and in no acute stress.  Vascular  Dorsalis pedis and posterior tibial  pulses are weakly  palpable  bilaterally.  Capillary return is within normal limits  bilaterally. Temperature is within normal limits  bilaterally.  Neurologic  Senn-Weinstein monofilament wire test within normal limits  bilaterally. Muscle power within normal limits bilaterally.  Nails Thick disfigured discolored nails with subungual debris  from hallux to fifth toes bilaterally. No evidence of bacterial infection or drainage bilaterally.  Orthopedic  No limitations of motion  feet .  No crepitus or effusions noted.  No bony pathology or digital deformities noted.  Skin  normotropic skin with no porokeratosis noted bilaterally.  No signs of infections or ulcers noted.     Onychomycosis  Nails  B/L.  Pain in right toes  Pain in left toes  Debridement of nails both feet followed trimming the nails with dremel tool.    RTC 3 months.   Gardiner Barefoot DPM

## 2023-12-10 ENCOUNTER — Encounter: Payer: Self-pay | Admitting: Physician Assistant

## 2024-01-23 ENCOUNTER — Ambulatory Visit (INDEPENDENT_AMBULATORY_CARE_PROVIDER_SITE_OTHER): Admitting: Podiatry

## 2024-01-23 DIAGNOSIS — M79674 Pain in right toe(s): Secondary | ICD-10-CM

## 2024-01-23 DIAGNOSIS — B351 Tinea unguium: Secondary | ICD-10-CM

## 2024-01-23 DIAGNOSIS — M79675 Pain in left toe(s): Secondary | ICD-10-CM

## 2024-01-23 NOTE — Progress Notes (Signed)
This patient presents to the office with chief complaint of long thick painful nails.  Patient says the nails are painful walking and wearing shoes.  This patient is unable to self treat.  This patient is unable to trim her nails since she is unable to reach her nails. She presents to the office with her daughter. She presents to the office for preventative foot care services.   General Appearance  Alert, conversant and in no acute stress.  Vascular  Dorsalis pedis and posterior tibial  pulses are weakly  palpable  bilaterally.  Capillary return is within normal limits  bilaterally. Temperature is within normal limits  bilaterally.  Neurologic  Senn-Weinstein monofilament wire test within normal limits  bilaterally. Muscle power within normal limits bilaterally.  Nails Thick disfigured discolored nails with subungual debris  from hallux to fifth toes bilaterally. No evidence of bacterial infection or drainage bilaterally.  Orthopedic  No limitations of motion  feet .  No crepitus or effusions noted.  No bony pathology or digital deformities noted.  Skin  normotropic skin with no porokeratosis noted bilaterally.  No signs of infections or ulcers noted.     Onychomycosis  Nails  B/L.  Pain in right toes  Pain in left toes  Debridement of nails both feet followed trimming the nails with dremel tool.    RTC 3 months.   Gardiner Barefoot DPM

## 2024-01-27 ENCOUNTER — Ambulatory Visit (INDEPENDENT_AMBULATORY_CARE_PROVIDER_SITE_OTHER): Admitting: Physician Assistant

## 2024-01-27 ENCOUNTER — Encounter: Payer: Self-pay | Admitting: Physician Assistant

## 2024-01-27 VITALS — BP 128/80 | HR 77 | Ht <= 58 in | Wt 155.0 lb

## 2024-01-27 DIAGNOSIS — F0280 Dementia in other diseases classified elsewhere without behavioral disturbance: Secondary | ICD-10-CM | POA: Diagnosis not present

## 2024-01-27 DIAGNOSIS — G301 Alzheimer's disease with late onset: Secondary | ICD-10-CM

## 2024-01-27 NOTE — Patient Instructions (Signed)
 It was a pleasure to see you today at our office.   Recommendations:  Follow up in 67 months.     Whom to call:  Memory  decline, memory medications: Call our office [phone]   For psychiatric meds, mood meds: Please have your primary care physician manage these medications.       For assessment of decision of mental capacity and competency:  Call Dr. Laverne Potter, geriatric psychiatrist at [phone]  For guidance in geriatric dementia issues please call [facility] [phone]  For guidance regarding [facility] and if placement were needed at the facility, contact Forrestine Ike, Social Worker tel: [phone]  If you have any severe symptoms of a stroke, or other severe issues such as confusion,severe chills or fever, etc call 911 or go to the ER as you may need to be evaluated further        RECOMMENDATIONS FOR ALL PATIENTS WITH MEMORY PROBLEMS: 1. Continue to exercise (Recommend 30 minutes of walking everyday, or 3 hours every week) 2. Increase social interactions - continue going to [place] and enjoy social gatherings with friends and family 3. Eat healthy, avoid fried foods and eat more fruits and vegetables 4. Maintain adequate blood pressure, blood sugar, and blood cholesterol level. Reducing the risk of stroke and cardiovascular disease also helps promoting better memory. 5. Avoid stressful situations. Live a simple life and avoid aggravations. Organize your time and prepare for the next day in anticipation. 6. Sleep well, avoid any interruptions of sleep and avoid any distractions in the bedroom that may interfere with adequate sleep quality 7. Avoid sugar, avoid sweets as there is a strong link between excessive sugar intake, diabetes, and cognitive impairment We discussed the Mediterranean diet, which has been shown to help patients reduce the risk of progressive memory disorders and reduces cardiovascular risk. This includes  eating fish, eat fruits and green leafy vegetables, nuts like almonds and hazelnuts, walnuts, and also use olive oil. Avoid fast foods and fried foods as much as possible. Avoid sweets and sugar as sugar use has been linked to worsening of memory function.  There is always a concern of gradual progression of memory problems. If this is the case, then we may need to adjust level of care according to patient needs. Support, both to the patient and caregiver, should then be put into place.    FALL PRECAUTIONS: Be cautious when walking. Scan the area for obstacles that may increase the risk of trips and falls. When getting up in the mornings, sit up at the edge of the bed for a few minutes before getting out of bed. Consider elevating the bed at the head end to avoid drop of blood pressure when getting up. Walk always in a well-lit room (use night lights in the walls). Avoid area rugs or power cords from appliances in the middle of the walkways. Use a walker or a cane if necessary and consider physical therapy for balance exercise. Get your eyesight checked regularly.  FINANCIAL OVERSIGHT: Supervision, especially oversight when making financial decisions or transactions is also recommended.  HOME SAFETY: Consider the safety of the kitchen when operating appliances like stoves, microwave oven, and blender. Consider having supervision and share cooking responsibilities until no longer able to participate in those. Accidents with firearms and other hazards in the house should be identified and addressed as well.   ABILITY TO BE LEFT ALONE: If patient is unable to contact 911 operator, consider using [employer], or  when the need is there, arrange for someone to stay with patients. Smoking is a fire hazard, consider supervision or cessation. Risk of wandering should be assessed by caregiver and if detected at any point, supervision and safe proof recommendations should be instituted.  MEDICATION SUPERVISION:  Inability to self-administer medication needs to be constantly addressed. Implement a mechanism to ensure safe administration of the medications.   DRIVING: Regarding driving, in patients with progressive memory problems, driving will be impaired. We advise to have someone else do the driving if trouble finding directions or if minor accidents are reported. Independent driving assessment is available to determine safety of driving.   If you are interested in the driving assessment, you can contact the following:  [employer] in [place] [phone]  [facility] [phone]  [facility] [phone]  [facility] [phone] or [phone]

## 2024-01-27 NOTE — Progress Notes (Signed)
 Assessment/Plan:   Dementia likely due to Alzheimer disease   Melody Wilson is a very pleasant 89 y.o. RH female with a  prior breast cancer, B12 deficiency, osteoporosis, hyperlipidemia, depression in remission, dementia likely due to Alzheimer's disease seen today in follow up for memory loss. Patient is no longer taking Donepezil  10 mg daily and memantine  10 mg twice daily, did not want to take it, and schedule was difficult for her to be compliant with it. She needs some help with her ADLs, but tries to remain active. MMSE today was 17/30.      Follow up in  6-7 months. Recommend good control of her cardiovascular risk factors Continue to control mood as per PCP Agree with moving to assisted living.     Subjective:    This patient is accompanied in the office by her son who supplements the history.  Previous records as well as any outside records available were reviewed prior to todays visit. Patient was last seen on 08/15/2023, with MMSE 10/30   Any changes in memory since last visit? "  She has some good and bad days, mostly good ".  She remains fairly active her ADLs, she enjoys her morning independent living.  She has a good social life. repeats oneself?  Endorsed, not very often however.  Disoriented when walking into a room? Denies    Leaving objects?  May misplace things but not in unusual places   Wandering behavior?  denies   Any personality changes since last visit?  Denies.   Any worsening depression?:  Denies.   Hallucinations or paranoia?  Denies.   Seizures? denies    Any sleep changes? Sleeps well . She has  vivid dreams, denies REM behavior or sleepwalking   Sleep apnea?   Denies.   Any hygiene concerns? Needs reminder Independent of bathing and dressing?  Endorsed.  Does the patient needs help with medications?  Son is in charge   Who is in charge of the finances?  Son is in charge     Any changes in appetite?  denies     Patient have trouble swallowing?  Denies.   Does the patient cook? No Any headaches?  She has a history of tension headaches, well-controlled. Any vision changes? Chronic back pain  denies   Ambulates with difficulty? Denies.  She remains active  Recent falls or head injuries? Denies.     Unilateral weakness, numbness or tingling? denies   Any tremors?  Denies   Any anosmia?  Denies   Any incontinence of urine?  Endorsed , has a few "accidents". Any bowel dysfunction?  Occasional diarrhea.      Patient lives at [facility]    Does the patient drive? No longer drives    "HISTORY OF PRESENT ILLNESS (from 04/15/2020):  This is a pleasant 89 year old right-handed woman with a history of breast cancer presenting for evaluation of dementia. Her 2 sons Melody Wilson and Melody Wilson are present and provided additional information privately as well. The patient feels her memory is "ok for my age." Her sons started noticing short-term memory changes for the past few years, but more concerning recently. She forgets dates and appointments. Bills were not getting paid, her sons report partly due to being unable to get to places to physically pay them being a challenge. She had not paid the homeowners fee last May. She gets confused when he asks her questions. She gets frustrated and would shut down,  not wanting to talk. She continues to drive locally and denies getting lost, sons have not gotten any calls about this. They state she hardly leaves the house. She is not taking any regular medications. She used to have Tramadol for her back pain but she had started taking it daily so her sons took it out of the house. She takes Aleve prn. She does not cook as much and denies leaving the stove on. She has noticed she misplaces things, she has to double check things more. Her sons report she is writing things down and making lists. She is independent with dressing and bathing, no hygiene concerns, house is taken care of. No paranoia or  hallucinations. There was only one incident in the early Spring where she was convinced David's neighbor left a pile of wood in her neighbor's lawn, when he did not. No family history of dementia. No history of concussions or alcohol use.    She has had a daily headache for the past couple of months. She reports an achy pain on the vertex, like a headband, lasting a few hours. She does not take the Aleve daily for the headaches. There is no associated nausea/vomiting, photo/phonophobia, vision changes. She denies any dizziness, diplopia, dysarthria/dysphagia, bowel/bladder dysfunction, anosmia, or tremors. She reports pain going down her left arm and arthritis in both hands. Sleep is good, no wandering behavior. No falls. "    Neurocognitive Feedback Note (Dr. Elmer Hackney, 05/26/20) :   "..We discussed impression of mild stage dementia. We had a long discussion about necessary supports at this stage of the disease, which in Melody Wilson's case is mostly already being done, such as finance and medications. We also discussed settings that are appropriate for individuals with mild stage AD, including home with some assistance, assisted living, and independent living. Melody Wilson son's are concerned that she is quite isolated although she is hesitant to leave the home, and we processed that. Melody Wilson.There are no safety concerns at present."  PREVIOUS MEDICATIONS:   CURRENT MEDICATIONS:  Outpatient Encounter Medications as of 01/27/2024  Medication Sig   aspirin 81 MG tablet Take 81 mg by mouth daily.   Multiple Vitamins-Minerals (MULTIVITAMIN WITH MINERALS) tablet Take 1 tablet by mouth daily.   vitamin C (ASCORBIC ACID) 500 MG tablet Take 500 mg by mouth daily.   donepezil  (ARICEPT ) 10 MG tablet Take 1 tablet by mouth once daily (Patient not taking: Reported on 01/27/2024)   gabapentin  (NEURONTIN ) 100 MG capsule Take 1 capsule (100 mg total) by mouth at bedtime. (Patient not taking: Reported on 01/27/2024)    memantine  (NAMENDA ) 10 MG tablet TAKE  1 tablet  twice a day (Patient not taking: Reported on 01/27/2024)   naproxen sodium (ANAPROX) 220 MG tablet Take 220 mg by mouth 2 (two) times daily with a meal. (Patient not taking: Reported on 01/27/2024)   vitamin E 400 UNIT capsule Take 400 Units by mouth daily. (Patient not taking: Reported on 01/27/2024)   No facility-administered encounter medications on file as of 01/27/2024.       01/27/2024    3:00 PM 08/15/2023    3:00 PM 01/08/2023    5:00 PM  MMSE - Mini Mental State Exam  Orientation to time 0 0 1  Orientation to Place 2 3 5   Registration 3 3 3   Attention/ Calculation 5 5 4   Recall 0 0 0  Language- name 2 objects 2 2 2   Language- repeat 1 1 1   Language- follow 3  step command 2 3 3   Language- read & follow direction 1 1 1   Write a sentence 1 1 0  Copy design 0 1 0  Total score 17 20 20        No data to display          Objective:     PHYSICAL EXAMINATION:    VITALS:   Vitals:   01/27/24 1512  BP: 128/80  Pulse: 77  SpO2: 93%  Weight: 155 lb (70.3 kg)  Height: 4\' 10"  (1.473 m)    GEN:  The patient appears stated age and is in NAD. HEENT:  Normocephalic, atraumatic.   Neurological examination:  General: NAD, well-groomed, appears stated age. Orientation: The patient is alert. Oriented to person, not place and date Cranial nerves: There is good facial symmetry.The speech is fluent and clear. No aphasia or dysarthria. Fund of knowledge is appropriate. Recent and remote memory are impaired. Attention and concentration are reduced. Able to name objects and repeat phrases.  Hearing is decreased to conversational tone.   Sensation: Sensation is intact to light touch throughout Motor: Strength is at least antigravity x4. DTR's 2/4 in UE/LE     Movement examination: Tone: There is normal tone in the UE/LE Abnormal movements:  no tremor.  No myoclonus.  No asterixis.   Coordination:  There is no decremation with RAM's.  Normal finger to nose  Gait and Station: The patient has no difficulty arising out of a deep-seated chair without the use of the hands. The patient's stride length is good.  Gait is cautious and narrow.    Thank you for allowing us  the opportunity to participate in the care of this nice patient. Please do not hesitate to contact us  for any questions or concerns.   Total time spent on today's visit was 26 minutes dedicated to this patient today, preparing to see patient, examining the patient, ordering tests and/or medications and counseling the patient, documenting clinical information in the EHR or other health record, independently interpreting results and communicating results to the patient/family, discussing treatment and goals, answering patient's questions and coordinating care.  Cc:  Olin Bertin, MD  Tex Filbert 01/27/2024 3:46 PM

## 2024-02-13 ENCOUNTER — Ambulatory Visit: Payer: Medicare Other | Admitting: Physician Assistant

## 2024-04-27 ENCOUNTER — Ambulatory Visit (INDEPENDENT_AMBULATORY_CARE_PROVIDER_SITE_OTHER): Admitting: Podiatry

## 2024-04-27 ENCOUNTER — Encounter: Payer: Self-pay | Admitting: Podiatry

## 2024-04-27 DIAGNOSIS — M79675 Pain in left toe(s): Secondary | ICD-10-CM | POA: Diagnosis not present

## 2024-04-27 DIAGNOSIS — M79674 Pain in right toe(s): Secondary | ICD-10-CM | POA: Diagnosis not present

## 2024-04-27 DIAGNOSIS — B351 Tinea unguium: Secondary | ICD-10-CM | POA: Diagnosis not present

## 2024-04-27 NOTE — Progress Notes (Signed)
This patient presents to the office with chief complaint of long thick painful nails.  Patient says the nails are painful walking and wearing shoes.  This patient is unable to self treat.  This patient is unable to trim her nails since she is unable to reach her nails. She presents to the office with her daughter. She presents to the office for preventative foot care services.   General Appearance  Alert, conversant and in no acute stress.  Vascular  Dorsalis pedis and posterior tibial  pulses are weakly  palpable  bilaterally.  Capillary return is within normal limits  bilaterally. Temperature is within normal limits  bilaterally.  Neurologic  Senn-Weinstein monofilament wire test within normal limits  bilaterally. Muscle power within normal limits bilaterally.  Nails Thick disfigured discolored nails with subungual debris  from hallux to fifth toes bilaterally. No evidence of bacterial infection or drainage bilaterally.  Orthopedic  No limitations of motion  feet .  No crepitus or effusions noted.  No bony pathology or digital deformities noted.  Skin  normotropic skin with no porokeratosis noted bilaterally.  No signs of infections or ulcers noted.     Onychomycosis  Nails  B/L.  Pain in right toes  Pain in left toes  Debridement of nails both feet followed trimming the nails with dremel tool.    RTC 3 months.   Gardiner Barefoot DPM

## 2024-05-08 DIAGNOSIS — Z23 Encounter for immunization: Secondary | ICD-10-CM | POA: Diagnosis not present

## 2024-07-15 DIAGNOSIS — R635 Abnormal weight gain: Secondary | ICD-10-CM | POA: Diagnosis not present

## 2024-07-15 DIAGNOSIS — F3341 Major depressive disorder, recurrent, in partial remission: Secondary | ICD-10-CM | POA: Diagnosis not present

## 2024-07-15 DIAGNOSIS — Z0000 Encounter for general adult medical examination without abnormal findings: Secondary | ICD-10-CM | POA: Diagnosis not present

## 2024-07-15 DIAGNOSIS — Z23 Encounter for immunization: Secondary | ICD-10-CM | POA: Diagnosis not present

## 2024-07-15 DIAGNOSIS — F0390 Unspecified dementia without behavioral disturbance: Secondary | ICD-10-CM | POA: Diagnosis not present

## 2024-07-15 DIAGNOSIS — R600 Localized edema: Secondary | ICD-10-CM | POA: Diagnosis not present

## 2024-07-15 DIAGNOSIS — M810 Age-related osteoporosis without current pathological fracture: Secondary | ICD-10-CM | POA: Diagnosis not present

## 2024-07-15 DIAGNOSIS — G301 Alzheimer's disease with late onset: Secondary | ICD-10-CM | POA: Diagnosis not present

## 2024-07-15 DIAGNOSIS — E7800 Pure hypercholesterolemia, unspecified: Secondary | ICD-10-CM | POA: Diagnosis not present

## 2024-07-15 DIAGNOSIS — E559 Vitamin D deficiency, unspecified: Secondary | ICD-10-CM | POA: Diagnosis not present

## 2024-07-28 ENCOUNTER — Encounter: Payer: Self-pay | Admitting: Podiatry

## 2024-07-28 ENCOUNTER — Ambulatory Visit: Admitting: Podiatry

## 2024-07-28 DIAGNOSIS — B351 Tinea unguium: Secondary | ICD-10-CM

## 2024-07-28 DIAGNOSIS — M79674 Pain in right toe(s): Secondary | ICD-10-CM | POA: Diagnosis not present

## 2024-07-28 DIAGNOSIS — M79675 Pain in left toe(s): Secondary | ICD-10-CM | POA: Diagnosis not present

## 2024-07-28 NOTE — Progress Notes (Signed)
This patient presents to the office with chief complaint of long thick painful nails.  Patient says the nails are painful walking and wearing shoes.  This patient is unable to self treat.  This patient is unable to trim her nails since she is unable to reach her nails. She presents to the office with her daughter. She presents to the office for preventative foot care services.   General Appearance  Alert, conversant and in no acute stress.  Vascular  Dorsalis pedis and posterior tibial  pulses are weakly  palpable  bilaterally.  Capillary return is within normal limits  bilaterally. Temperature is within normal limits  bilaterally.  Neurologic  Senn-Weinstein monofilament wire test within normal limits  bilaterally. Muscle power within normal limits bilaterally.  Nails Thick disfigured discolored nails with subungual debris  from hallux to fifth toes bilaterally. No evidence of bacterial infection or drainage bilaterally.  Orthopedic  No limitations of motion  feet .  No crepitus or effusions noted.  No bony pathology or digital deformities noted.  Skin  normotropic skin with no porokeratosis noted bilaterally.  No signs of infections or ulcers noted.     Onychomycosis  Nails  B/L.  Pain in right toes  Pain in left toes  Debridement of nails both feet followed trimming the nails with dremel tool.    RTC 3 months.   Gardiner Barefoot DPM

## 2024-07-29 ENCOUNTER — Encounter: Payer: Self-pay | Admitting: Physician Assistant

## 2024-07-29 ENCOUNTER — Ambulatory Visit: Admitting: Physician Assistant

## 2024-07-29 VITALS — BP 119/75 | HR 87 | Resp 20 | Ht <= 58 in | Wt 161.0 lb

## 2024-07-29 DIAGNOSIS — G301 Alzheimer's disease with late onset: Secondary | ICD-10-CM | POA: Diagnosis not present

## 2024-07-29 DIAGNOSIS — F0280 Dementia in other diseases classified elsewhere without behavioral disturbance: Secondary | ICD-10-CM | POA: Diagnosis not present

## 2024-07-29 NOTE — Patient Instructions (Signed)
 It was a pleasure to see you today at our office.   Recommendations:  Follow up in 67 months.     Whom to call:  Memory  decline, memory medications: Call our office [phone]   For psychiatric meds, mood meds: Please have your primary care physician manage these medications.       For assessment of decision of mental capacity and competency:  Call Dr. Laverne Potter, geriatric psychiatrist at [phone]  For guidance in geriatric dementia issues please call [facility] [phone]  For guidance regarding [facility] and if placement were needed at the facility, contact Forrestine Ike, Social Worker tel: [phone]  If you have any severe symptoms of a stroke, or other severe issues such as confusion,severe chills or fever, etc call 911 or go to the ER as you may need to be evaluated further        RECOMMENDATIONS FOR ALL PATIENTS WITH MEMORY PROBLEMS: 1. Continue to exercise (Recommend 30 minutes of walking everyday, or 3 hours every week) 2. Increase social interactions - continue going to [place] and enjoy social gatherings with friends and family 3. Eat healthy, avoid fried foods and eat more fruits and vegetables 4. Maintain adequate blood pressure, blood sugar, and blood cholesterol level. Reducing the risk of stroke and cardiovascular disease also helps promoting better memory. 5. Avoid stressful situations. Live a simple life and avoid aggravations. Organize your time and prepare for the next day in anticipation. 6. Sleep well, avoid any interruptions of sleep and avoid any distractions in the bedroom that may interfere with adequate sleep quality 7. Avoid sugar, avoid sweets as there is a strong link between excessive sugar intake, diabetes, and cognitive impairment We discussed the Mediterranean diet, which has been shown to help patients reduce the risk of progressive memory disorders and reduces cardiovascular risk. This includes  eating fish, eat fruits and green leafy vegetables, nuts like almonds and hazelnuts, walnuts, and also use olive oil. Avoid fast foods and fried foods as much as possible. Avoid sweets and sugar as sugar use has been linked to worsening of memory function.  There is always a concern of gradual progression of memory problems. If this is the case, then we may need to adjust level of care according to patient needs. Support, both to the patient and caregiver, should then be put into place.    FALL PRECAUTIONS: Be cautious when walking. Scan the area for obstacles that may increase the risk of trips and falls. When getting up in the mornings, sit up at the edge of the bed for a few minutes before getting out of bed. Consider elevating the bed at the head end to avoid drop of blood pressure when getting up. Walk always in a well-lit room (use night lights in the walls). Avoid area rugs or power cords from appliances in the middle of the walkways. Use a walker or a cane if necessary and consider physical therapy for balance exercise. Get your eyesight checked regularly.  FINANCIAL OVERSIGHT: Supervision, especially oversight when making financial decisions or transactions is also recommended.  HOME SAFETY: Consider the safety of the kitchen when operating appliances like stoves, microwave oven, and blender. Consider having supervision and share cooking responsibilities until no longer able to participate in those. Accidents with firearms and other hazards in the house should be identified and addressed as well.   ABILITY TO BE LEFT ALONE: If patient is unable to contact 911 operator, consider using [employer], or  when the need is there, arrange for someone to stay with patients. Smoking is a fire hazard, consider supervision or cessation. Risk of wandering should be assessed by caregiver and if detected at any point, supervision and safe proof recommendations should be instituted.  MEDICATION SUPERVISION:  Inability to self-administer medication needs to be constantly addressed. Implement a mechanism to ensure safe administration of the medications.   DRIVING: Regarding driving, in patients with progressive memory problems, driving will be impaired. We advise to have someone else do the driving if trouble finding directions or if minor accidents are reported. Independent driving assessment is available to determine safety of driving.   If you are interested in the driving assessment, you can contact the following:  [employer] in [place] [phone]  [facility] [phone]  [facility] [phone]  [facility] [phone] or [phone]

## 2024-07-29 NOTE — Progress Notes (Signed)
 Assessment and Plan  Melody Wilson is a very pleasant 89 y.o. RH female with a history of prior breast cancer, B12 deficiency, osteoporosis, hyperlipidemia, depression in remission, dementia likely due to Alzheimer's disease  presenting today in follow-up for evaluation of memory loss. Patient is no longer on dementia medications per choice.  She needs help with her ADLs but tries to remain active.  Mood is good.This patient is accompanied in the office by her son who supplements the history. Previous records as well as any outside records available were reviewed prior to todays visit.   Patient was last seen on 01/27/2024.   Follow-up in 6-7 months recommend good control of cardiovascular risk factors Continue to control mood as per PCP Recommend hearing evaluation to improve comprehension   Discussed the use of AI scribe software for clinical note transcription with the patient, who gave verbal consent to proceed.  History of Present Illness Melody Wilson is a 89 year old female who presents for a follow-up regarding her memory and cognitive function.  Her memory remains stable compared to six months ago, with some variability between good and bad days, though mostly good. She is active in her daily activities and enjoys living in an independent living facility, maintaining a good social life. She does not take any memory medications and occasionally repeats herself or asks the same question, but this is infrequent. She recognizes familiar people and surroundings and does not exhibit wandering behavior.  She and her family report no personality changes, mood changes, hallucinations, or paranoia. She does not feel depressed or anxious. She sleeps well without nightmares or vivid dreams. Her appetite is good, and she eats well, though she acknowledges she could drink more water. There are no issues with swallowing or choking.  Denies any recent severe headaches. No vision changes,  chronic pain, or recent falls. She does not use a walker. She denies any weakness or signs of stroke.     HISTORY OF PRESENT ILLNESS (from 04/15/2020):  This is a pleasant 89 year old right-handed woman with a history of breast cancer presenting for evaluation of dementia. Her 2 sons Alm and Gerard are present and provided additional information privately as well. The patient feels her memory is ok for my age. Her sons started noticing short-term memory changes for the past few years, but more concerning recently. She forgets dates and appointments. Bills were not getting paid, her sons report partly due to being unable to get to places to physically pay them being a challenge. She had not paid the homeowners fee last May. She gets confused when he asks her questions. She gets frustrated and would shut down, not wanting to talk. She continues to drive locally and denies getting lost, sons have not gotten any calls about this. They state she hardly leaves the house. She is not taking any regular medications. She used to have Tramadol for her back pain but she had started taking it daily so her sons took it out of the house. She takes Aleve prn. She does not cook as much and denies leaving the stove on. She has noticed she misplaces things, she has to double check things more. Her sons report she is writing things down and making lists. She is independent with dressing and bathing, no hygiene concerns, house is taken care of. No paranoia or hallucinations. There was only one incident in the early Spring where she was convinced David's neighbor  left a pile of wood in her neighbor's lawn, when he did not. No family history of dementia. No history of concussions or alcohol use.    She has had a daily headache for the past couple of months. She reports an achy pain on the vertex, like a headband, lasting a few hours. She does not take the Aleve daily for the headaches. There is no associated nausea/vomiting,  photo/phonophobia, vision changes. She denies any dizziness, diplopia, dysarthria/dysphagia, bowel/bladder dysfunction, anosmia, or tremors. She reports pain going down her left arm and arthritis in both hands. Sleep is good, no wandering behavior. No falls.     Neurocognitive Feedback Note (Dr. Maude Rattler, 05/26/20) :   ..We discussed impression of mild stage dementia. We had a long discussion about necessary supports at this stage of the disease, which in Ms. Hellard's case is mostly already being done, such as finance and medications. We also discussed settings that are appropriate for individuals with mild stage AD, including home with some assistance, assisted living, and independent living. Ms. Halvorsen son's are concerned that she is quite isolated although she is hesitant to leave the home, and we processed that there are no safety concerns at present.  PREVIOUS MEDICATIONS:      Objective:     PHYSICAL EXAMINATION:    VITALS:   Vitals:   07/29/24 1458  BP: 119/75  Pulse: 87  Resp: 20  SpO2: 97%  Weight: 161 lb (73 kg)  Height: 4' 10 (1.473 m)    GEN:  The patient appears stated age and is in NAD. HEENT:  Normocephalic, atraumatic.         No data to display             01/27/2024    3:00 PM 08/15/2023    3:00 PM 01/08/2023    5:00 PM  MMSE - Mini Mental State Exam  Orientation to time 0 0 1  Orientation to Place 2 3 5   Registration 3 3 3   Attention/ Calculation 5 5 4   Recall 0 0 0  Language- name 2 objects 2 2 2   Language- repeat 1 1 1   Language- follow 3 step command 2 3 3   Language- read & follow direction 1 1 1   Write a sentence 1 1 0  Copy design 0 1 0  Total score 17 20 20         Neurological examination:  Orientation: The patient is alert. Oriented to person, not to place and date. Cranial nerves: There is good facial symmetry.The speech is fluent and clear, takes time to answer her questions. No aphasia or dysarthria. Fund of knowledge is  appropriate. Recent memory impaired and remote memory is normal.  Attention and concentration are normal.  Able to name objects and repeat phrases.  Hearing is decreased to conversational tone .     Sensation: Sensation is intact to light touch throughout Motor: Strength is at least antigravity x4. DTR's 2/4 in UE/LE   Movement examination: Tone: There is normal tone in the UE/LE Abnormal movements:  no tremor.  No myoclonus.  No asterixis.   Coordination:  There is no decremation with RAM's. Normal finger to nose  Gait and Station: The patient has no difficulty arising out of a deep-seated chair without the use of the hands. The patient's stride length is good.  Gait is cautious and narrow.   Thank you for allowing us  the opportunity to participate in the care of this nice patient. Please do  not hesitate to contact us  for any questions or concerns.   Total time spent on today's visit was 30 minutes dedicated to this patient today, preparing to see patient, examining the patient, ordering tests and/or medications and counseling the patient, documenting clinical information in the EHR or other health record, independently interpreting results and communicating results to the patient/family, discussing treatment and goals, answering patient's questions and coordinating care.  Cc:  Verena Mems, MD  Camie Sevin 07/29/2024 4:57 PM

## 2024-08-04 ENCOUNTER — Emergency Department (HOSPITAL_BASED_OUTPATIENT_CLINIC_OR_DEPARTMENT_OTHER): Admitting: Radiology

## 2024-08-04 ENCOUNTER — Other Ambulatory Visit: Payer: Self-pay

## 2024-08-04 ENCOUNTER — Emergency Department (HOSPITAL_BASED_OUTPATIENT_CLINIC_OR_DEPARTMENT_OTHER)
Admission: EM | Admit: 2024-08-04 | Discharge: 2024-08-05 | Disposition: A | Discharge status: Home / Self Care | Attending: Emergency Medicine | Admitting: Emergency Medicine

## 2024-08-04 DIAGNOSIS — S7002XA Contusion of left hip, initial encounter: Secondary | ICD-10-CM

## 2024-08-04 DIAGNOSIS — S20212A Contusion of left front wall of thorax, initial encounter: Secondary | ICD-10-CM | POA: Diagnosis not present

## 2024-08-04 DIAGNOSIS — S299XXA Unspecified injury of thorax, initial encounter: Secondary | ICD-10-CM | POA: Diagnosis present

## 2024-08-04 DIAGNOSIS — Z7982 Long term (current) use of aspirin: Secondary | ICD-10-CM | POA: Diagnosis not present

## 2024-08-04 DIAGNOSIS — S298XXA Other specified injuries of thorax, initial encounter: Secondary | ICD-10-CM

## 2024-08-04 DIAGNOSIS — W19XXXA Unspecified fall, initial encounter: Secondary | ICD-10-CM

## 2024-08-04 LAB — CBC
HCT: 43.6 % (ref 36.0–46.0)
Hemoglobin: 14.5 g/dL (ref 12.0–15.0)
MCH: 30.7 pg (ref 26.0–34.0)
MCHC: 33.3 g/dL (ref 30.0–36.0)
MCV: 92.4 fL (ref 80.0–100.0)
Platelets: 202 K/uL (ref 150–400)
RBC: 4.72 MIL/uL (ref 3.87–5.11)
RDW: 12.7 % (ref 11.5–15.5)
WBC: 8.5 K/uL (ref 4.0–10.5)
nRBC: 0 % (ref 0.0–0.2)

## 2024-08-04 LAB — BASIC METABOLIC PANEL WITH GFR
Anion gap: 11 (ref 5–15)
BUN: 15 mg/dL (ref 8–23)
CO2: 27 mmol/L (ref 22–32)
Calcium: 9.9 mg/dL (ref 8.9–10.3)
Chloride: 101 mmol/L (ref 98–111)
Creatinine, Ser: 0.76 mg/dL (ref 0.44–1.00)
GFR, Estimated: >60 mL/min (ref >60)
Glucose, Bld: 109 mg/dL — ABNORMAL HIGH (ref 70–99)
Potassium: 3.8 mmol/L (ref 3.5–5.1)
Sodium: 139 mmol/L (ref 135–145)

## 2024-08-04 MED ORDER — NAPROXEN 250 MG PO TABS
250.0000 mg | ORAL_TABLET | Freq: Once | ORAL | Status: AC
  Administered 2024-08-04: 23:00:00 250 mg via ORAL
  Filled 2024-08-04: qty 1

## 2024-08-04 NOTE — ED Notes (Signed)
 Pt ambulated in hallway independently, standby by assist only. Pt says she does not use a cane or walker at home.

## 2024-08-04 NOTE — ED Provider Notes (Signed)
 [place] EMERGENCY DEPARTMENT AT [facility] Provider Note   CSN: [id number] Arrival date & time: 08/04/24  1755     Patient presents with: No chief complaint on file.   Melody Wilson is a 89 y.o. female.   HPI     89 year old female comes in with chief complaint of mechanical fall.  Patient accompanied by her children.  Patient has no concerning past medical history.  Patient resides at [facility].  She does have some cognitive issues.  Patient was last seen ambulating on Sunday.  Today children received a call from the patient, who indicated that she was having pain, and that she had a fall.  There was no witnessed fall by anyone.  Patient is complaining of pain to her hip and left back.  Patient states that she was turning, and lost her balance and fell.  She is unable to explain when she fell, indicating that the fall was 2 weeks ago.  2 days ago and family met her, she was walking fine.  Prior to Admission medications  Medication Sig Start Date End Date Taking? Authorizing Provider  aspirin 81 MG tablet Take 81 mg by mouth daily.    [provider]  Multiple Vitamins-Minerals (MULTIVITAMIN WITH MINERALS) tablet Take 1 tablet by mouth daily.    [provider]  vitamin C (ASCORBIC ACID) 500 MG tablet Take 500 mg by mouth daily.    [provider]    Allergies: Acetaminophen and Omeprazole    Review of Systems  All other systems reviewed and are negative.   Updated Vital Signs BP (!) 149/81   Pulse 89   Temp 98.4 F (36.9 C)   Resp 19   SpO2 93%   Physical Exam Vitals and nursing note reviewed.  Constitutional:      Appearance: She is well-developed.  HENT:     Head: Normocephalic and atraumatic.  Eyes:     Extraocular Movements: Extraocular movements intact.     Pupils: Pupils are equal, round, and reactive to light.  Neck:     Comments: No midline c-spine tenderness Cardiovascular:     Rate and Rhythm:  Normal rate and regular rhythm.  Pulmonary:     Effort: Pulmonary effort is normal. No respiratory distress.     Breath sounds: Normal breath sounds.  Chest:     Chest wall: No tenderness.  Abdominal:     General: Bowel sounds are normal. There is no distension.     Palpations: Abdomen is soft.     Tenderness: There is no abdominal tenderness.  Musculoskeletal:        General: No deformity.     Cervical back: Neck supple. No tenderness.     Comments: Patient had left posterior tenderness over the lower rib area and also left hip tenderness  No long bone tenderness - upper and lower extrmeities and no pelvic pain, instability.  Skin:    General: Skin is warm and dry.     Findings: No rash.  Neurological:     Mental Status: She is alert and oriented to person, place, and time.     Cranial Nerves: No cranial nerve deficit.     (all labs ordered are listed, but only abnormal results are displayed) Labs Reviewed  BASIC METABOLIC PANEL WITH GFR - Abnormal; Notable for the following components:      Result Value   Glucose, Bld 109 (*)    All other components within normal limits  CBC  URINALYSIS, ROUTINE W REFLEX MICROSCOPIC    EKG: None  Radiology: DG Hip Unilat W or Wo Pelvis 2-3 Views Left Result Date: 08/04/2024 EXAM: 2 or more VIEW(S) XRAY OF THE HIP 08/04/2024 10:42:00 PM COMPARISON: None available. CLINICAL HISTORY: fall FINDINGS: BONES AND JOINTS: No acute fracture. No malalignment. LUMBAR SPINE: Degenerative changes of the visualized lower lumbar spine. SOFT TISSUES: The soft tissues are unremarkable. IMPRESSION: 1. No acute fracture or dislocation. Electronically signed by: Oneil Devonshire MD 08/04/2024 11:15 PM EST RP Workstation: [id number]   DG Ribs Unilateral W/Chest Left Result Date: 08/04/2024 EXAM: AP VIEW XRAY OF THE LEFT RIBS AND CHEST 08/04/2024 06:46:00 PM COMPARISON: None available. CLINICAL HISTORY: Fall FINDINGS: BONES: Bony demineralization. No acute  displaced rib fracture. LUNGS AND PLEURA: Calcified granuloma in right upper lobe and calcified right hilar lymph nodes. Low lung volumes causing crowded pulmonary vasculature. Patchy bibasilar airspace opacities. No pleural effusion or pneumothorax. HEART AND MEDIASTINUM: Atherosclerotic calcification of aortic arch. No acute abnormality of the cardiac and mediastinal silhouettes. IMPRESSION: 1. No acute rib fracture. 2. Patchy bibasilar airspace opacities, likely atelectasis given low lung volumes. 3. Sequelae of prior granulomatous disease with calcified right upper lobe granuloma and calcified right hilar lymph nodes. Electronically signed by: Greig Pique MD 08/04/2024 07:04 PM EST RP Workstation: [id number]   DG Lumbar Spine Complete Result Date: 08/04/2024 EXAM: 4 OR MORE VIEW(S) XRAY OF THE LUMBAR SPINE 08/04/2024 06:47:00 PM COMPARISON: None available. CLINICAL HISTORY: pain after fall FINDINGS: LUMBAR SPINE: BONES: The bones are osteopenic. There is 20 percent compression deformity superior endplate of L2 which is new from 2006, but age indeterminate. There is no retropulsion or fracture fragments. Vertebral body heights are otherwise well maintained and alignment is anatomic. DISCS AND DEGENERATIVE CHANGES: There is mild disc space narrowing and endplate osteophyte formation throughout all levels of the lumbar spine compatible with degenerative change. SOFT TISSUES: There are atherosclerotic calcifications of the aorta. No acute abnormality. IMPRESSION: 1. 20% compression deformity of the superior endplate of L2, new since 2006 and age indeterminate, without retropulsion or fracture fragments. 2. Osteopenia. 3. Mild multilevel degenerative change of the lumbar spine. Electronically signed by: Greig Pique MD 08/04/2024 07:03 PM EST RP Workstation: [id number]   DG Hand Complete Right Result Date: 08/04/2024 EXAM: 3 OR MORE VIEW(S) XRAY OF THE HAND 08/04/2024 06:46:00 PM COMPARISON: None available.  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 SOFT TISSUES: The soft tissues are unremarkable. IMPRESSION: 1. No evidence of acute traumatic injury. 2. 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 Electronically signed by: Greig Pique MD 08/04/2024 06:59 PM EST RP Workstation: [id number]     Procedures   Medications Ordered in the ED  naproxen  (NAPROSYN ) tablet 250 mg (250 mg Oral Given 08/04/24 2255)                                    Medical Decision Making Amount and/or Complexity of Data Reviewed Labs: ordered. Radiology: ordered.  Risk Prescription drug management.   89 year old patient comes in after sustaining what appears to be a mechanical fall. Pertinent past medical includes no anticoagulation use. Collateral history provided by patient's son, was at the bedside.  It appears that patient was walking on Sunday.  She indicates that she had a fall 2 weeks ago, but there was no record of it per family.  The fall itself  was likely not witnessed.  Patient has a very reassuring overall exam.  Based on my history and exam, differential diagnosis includes: - Traumatic brain injury including intracranial hemorrhage - Long bone fractures - Contusions - Soft tissue injury - Concussion  Based on the initial assessment, the following workup was initiated x-ray of the ribs, x-ray of the hip,  I have independently interpreted the following imaging from the perspective of acute trauma: X-ray of the hip and the results indicate no evidence of any fracture or dislocation.  Patient has now ambulated.  She is stable for discharge. Family will have patient follow-up with PCP if her pain continues.  Final diagnoses:  Fall, initial encounter  Rib contusion, left, initial encounter  Contusion of left hip, initial encounter    ED Discharge Orders     None          Charlyn Sora, MD 08/04/24  2352

## 2024-08-04 NOTE — ED Triage Notes (Addendum)
 Clemens some time recently. Pt cannot remember. Unwitnessed at [facility]. Reports left rib pain, right hand pain. Denies head, neck pain, lumbar pain- denies pain in hip/pelvis. A+Ox3- baseline. Unsure exactly why she fell.

## 2024-08-04 NOTE — Discharge Instructions (Signed)
 We saw you in the ER after you had a fall. All the imaging results are normal, no fractures seen. Please be very careful with walking, and do everything possible to prevent falls.  You may take ibuprofen  over-the-counter for the next 5 days.  If your symptoms are not improving, then please contact your primary care doctor.

## 2024-11-02 ENCOUNTER — Ambulatory Visit: Admitting: Podiatry

## 2025-01-27 ENCOUNTER — Ambulatory Visit: Admitting: Physician Assistant
